# Patient Record
Sex: Male | Born: 1950 | Race: Black or African American | Hispanic: No | Marital: Married | Smoking: Never smoker
Health system: Southern US, Community
[De-identification: ages and names within clinical notes are randomized; demographics above are authoritative.]

## PROBLEM LIST (undated history)

## (undated) DIAGNOSIS — E785 Hyperlipidemia, unspecified: Secondary | ICD-10-CM

## (undated) DIAGNOSIS — F4310 Post-traumatic stress disorder, unspecified: Secondary | ICD-10-CM

## (undated) DIAGNOSIS — Z923 Personal history of irradiation: Secondary | ICD-10-CM

## (undated) DIAGNOSIS — F419 Anxiety disorder, unspecified: Secondary | ICD-10-CM

## (undated) DIAGNOSIS — F32A Depression, unspecified: Secondary | ICD-10-CM

## (undated) DIAGNOSIS — F329 Major depressive disorder, single episode, unspecified: Secondary | ICD-10-CM

## (undated) DIAGNOSIS — M1990 Unspecified osteoarthritis, unspecified site: Secondary | ICD-10-CM

## (undated) DIAGNOSIS — G4730 Sleep apnea, unspecified: Secondary | ICD-10-CM

## (undated) DIAGNOSIS — C61 Malignant neoplasm of prostate: Secondary | ICD-10-CM

## (undated) DIAGNOSIS — E291 Testicular hypofunction: Secondary | ICD-10-CM

## (undated) DIAGNOSIS — S76219A Strain of adductor muscle, fascia and tendon of unspecified thigh, initial encounter: Secondary | ICD-10-CM

## (undated) HISTORY — DX: Malignant neoplasm of prostate: C61

## (undated) HISTORY — DX: Testicular hypofunction: E29.1

## (undated) HISTORY — DX: Strain of adductor muscle, fascia and tendon of unspecified thigh, initial encounter: S76.219A

## (undated) HISTORY — DX: Major depressive disorder, single episode, unspecified: F32.9

## (undated) HISTORY — DX: Post-traumatic stress disorder, unspecified: F43.10

## (undated) HISTORY — DX: Unspecified osteoarthritis, unspecified site: M19.90

## (undated) HISTORY — DX: Personal history of irradiation: Z92.3

## (undated) HISTORY — PX: WISDOM TOOTH EXTRACTION: SHX21

## (undated) HISTORY — DX: Depression, unspecified: F32.A

## (undated) HISTORY — DX: Sleep apnea, unspecified: G47.30

## (undated) HISTORY — DX: Hyperlipidemia, unspecified: E78.5

## (undated) HISTORY — PX: TONSILLECTOMY: SUR1361

## (undated) HISTORY — DX: Anxiety disorder, unspecified: F41.9

---

## 2005-03-21 ENCOUNTER — Ambulatory Visit: Payer: Self-pay | Admitting: Internal Medicine

## 2009-06-24 ENCOUNTER — Ambulatory Visit: Payer: Self-pay | Admitting: Internal Medicine

## 2009-06-24 DIAGNOSIS — M1710 Unilateral primary osteoarthritis, unspecified knee: Secondary | ICD-10-CM

## 2009-06-24 DIAGNOSIS — F4310 Post-traumatic stress disorder, unspecified: Secondary | ICD-10-CM

## 2009-06-24 DIAGNOSIS — G4730 Sleep apnea, unspecified: Secondary | ICD-10-CM

## 2009-06-24 DIAGNOSIS — IMO0002 Reserved for concepts with insufficient information to code with codable children: Secondary | ICD-10-CM | POA: Insufficient documentation

## 2009-07-06 ENCOUNTER — Ambulatory Visit: Payer: Self-pay | Admitting: Internal Medicine

## 2009-07-06 LAB — CONVERTED CEMR LAB
BUN: 20 mg/dL (ref 6–23)
Basophils Absolute: 0.1 10*3/uL (ref 0.0–0.1)
Blood in Urine, dipstick: NEGATIVE
Chloride: 106 meq/L (ref 96–112)
Direct LDL: 170.3 mg/dL
Glucose, Bld: 83 mg/dL (ref 70–99)
Glucose, Urine, Semiquant: NEGATIVE
HCT: 45.1 % (ref 39.0–52.0)
HDL: 39.6 mg/dL (ref >39.00)
Lymphs Abs: 2.3 10*3/uL (ref 0.7–4.0)
MCHC: 34 g/dL (ref 30.0–36.0)
MCV: 102.6 fL — ABNORMAL HIGH (ref 78.0–100.0)
Monocytes Absolute: 0.3 10*3/uL (ref 0.1–1.0)
Platelets: 184 10*3/uL (ref 150.0–400.0)
Potassium: 4.1 meq/L (ref 3.5–5.1)
RDW: 12.4 % (ref 11.5–14.6)
Specific Gravity, Urine: 1.015
TSH: 1.29 microintl units/mL (ref 0.35–5.50)
Total Bilirubin: 1.6 mg/dL — ABNORMAL HIGH (ref 0.3–1.2)
WBC Urine, dipstick: NEGATIVE
pH: 5.5

## 2009-07-13 ENCOUNTER — Ambulatory Visit: Payer: Self-pay | Admitting: Internal Medicine

## 2009-11-10 ENCOUNTER — Ambulatory Visit: Payer: Self-pay | Admitting: Internal Medicine

## 2009-11-10 LAB — CONVERTED CEMR LAB
ALT: 38 units/L (ref 0–53)
AST: 33 units/L (ref 0–37)
Albumin: 3.6 g/dL (ref 3.5–5.2)
Alkaline Phosphatase: 52 units/L (ref 39–117)
Cholesterol: 217 mg/dL — ABNORMAL HIGH (ref 0–200)
Direct LDL: 137.9 mg/dL
Total Bilirubin: 1.3 mg/dL — ABNORMAL HIGH (ref 0.3–1.2)
Triglycerides: 191 mg/dL — ABNORMAL HIGH (ref 0.0–149.0)

## 2009-11-17 ENCOUNTER — Ambulatory Visit: Payer: Self-pay | Admitting: Internal Medicine

## 2009-11-17 DIAGNOSIS — N529 Male erectile dysfunction, unspecified: Secondary | ICD-10-CM

## 2009-11-17 DIAGNOSIS — E785 Hyperlipidemia, unspecified: Secondary | ICD-10-CM | POA: Insufficient documentation

## 2010-02-23 ENCOUNTER — Ambulatory Visit: Payer: Self-pay | Admitting: Internal Medicine

## 2010-02-23 LAB — CONVERTED CEMR LAB
Cholesterol: 249 mg/dL — ABNORMAL HIGH (ref 0–200)
HDL: 41.4 mg/dL (ref >39.00)
Total CHOL/HDL Ratio: 6
VLDL: 54.6 mg/dL — ABNORMAL HIGH (ref 0.0–40.0)

## 2010-06-23 ENCOUNTER — Telehealth: Payer: Self-pay | Admitting: Internal Medicine

## 2010-06-24 ENCOUNTER — Ambulatory Visit: Payer: Self-pay | Admitting: Family Medicine

## 2010-06-24 DIAGNOSIS — I10 Essential (primary) hypertension: Secondary | ICD-10-CM | POA: Insufficient documentation

## 2010-06-24 DIAGNOSIS — R51 Headache: Secondary | ICD-10-CM

## 2010-06-28 ENCOUNTER — Ambulatory Visit: Payer: Self-pay | Admitting: Cardiology

## 2010-07-26 ENCOUNTER — Ambulatory Visit (HOSPITAL_BASED_OUTPATIENT_CLINIC_OR_DEPARTMENT_OTHER)
Admission: RE | Admit: 2010-07-26 | Discharge: 2010-07-26 | Payer: Self-pay | Source: Home / Self Care | Admitting: Internal Medicine

## 2010-07-26 ENCOUNTER — Encounter: Payer: Self-pay | Admitting: Internal Medicine

## 2010-08-04 ENCOUNTER — Ambulatory Visit: Payer: Self-pay | Admitting: Pulmonary Disease

## 2010-08-10 ENCOUNTER — Ambulatory Visit: Payer: Self-pay | Admitting: Internal Medicine

## 2010-08-10 LAB — CONVERTED CEMR LAB
ALT: 35 units/L (ref 0–53)
Albumin: 3.9 g/dL (ref 3.5–5.2)
Basophils Relative: 0.6 % (ref 0.0–3.0)
Bilirubin Urine: NEGATIVE
CO2: 29 meq/L (ref 19–32)
Chloride: 103 meq/L (ref 96–112)
Cholesterol: 240 mg/dL — ABNORMAL HIGH (ref 0–200)
Direct LDL: 154 mg/dL
Eosinophils Absolute: 0.3 10*3/uL (ref 0.0–0.7)
Eosinophils Relative: 4 % (ref 0.0–5.0)
HCT: 43.8 % (ref 39.0–52.0)
Hemoglobin: 15 g/dL (ref 13.0–17.0)
MCHC: 34.3 g/dL (ref 30.0–36.0)
MCV: 101.9 fL — ABNORMAL HIGH (ref 78.0–100.0)
Monocytes Absolute: 0.6 10*3/uL (ref 0.1–1.0)
Neutro Abs: 4.2 10*3/uL (ref 1.4–7.7)
Nitrite: NEGATIVE
PSA: 2.92 ng/mL (ref 0.10–4.00)
Potassium: 4.2 meq/L (ref 3.5–5.1)
RBC: 4.3 M/uL (ref 4.22–5.81)
Sodium: 139 meq/L (ref 135–145)
Total CHOL/HDL Ratio: 6
Total Protein: 7.3 g/dL (ref 6.0–8.3)
Urobilinogen, UA: 1
VLDL: 37.8 mg/dL (ref 0.0–40.0)
WBC Urine, dipstick: NEGATIVE
WBC: 7.1 10*3/uL (ref 4.5–10.5)

## 2010-08-31 ENCOUNTER — Ambulatory Visit: Payer: Self-pay | Admitting: Internal Medicine

## 2010-10-12 NOTE — Progress Notes (Signed)
Summary: Pt having severe pains in head and dizzy spells  Phone Note Call from Patient Call back at [phone]   Caller: spouse - Vickey Reason for Call: Acute Illness Summary of Call: Pt has been having pains in his head and dizzy spells for over a week now and has been taking over the counter med, and nothing helps. Pt sayd pain is sharp,stabbing pain, and is sporatic all throughout the day. Pls advise. Pt req work in appt with Dr. Lovell Sheehan only or med called in to [facility] [address]   Initial call taken by: Lucy Antigua,  June 23, 2010 10:59 AM  Follow-up for Phone Call        obv given with dr Abner Greenspan for tomorrow Follow-up by: Willy Eddy, LPN,  June 23, 2010 12:02 PM

## 2010-10-12 NOTE — Assessment & Plan Note (Signed)
Summary: 4 mo rov/mm   Vital Signs:  Patient profile:   60 year old male Height:      73.5 inches Weight:      266 pounds BMI:     34.74 Temp:     98.2 degrees F oral Pulse rate:   76 / minute Resp:     14 per minute BP sitting:   130 / 82  (left arm)  Vitals Entered By: Willy Eddy, LPN (November 17, 3084 9:16 AM) CC: roa labs   CC:  roa labs.  History of Present Illness: weight loss impacted the lipids  Hyperlipidemia Follow-Up      This is a 60 year old man who presents for Hyperlipidemia follow-up.  The patient denies muscle aches, GI upset, abdominal pain, flushing, itching, constipation, diarrhea, and fatigue.  The patient denies the following symptoms: chest pain/pressure, exercise intolerance, dypsnea, palpitations, syncope, and pedal edema.  Compliance with medications (by patient report) has been near 100%.  Dietary compliance has been excellent.  The patient reports exercising daily.  Adjunctive measures currently used by the patient include fish oil supplements.    Preventive Screening-Counseling & Management  Alcohol-Tobacco     Smoking Status: never  Current Problems (verified): 1)  Health Maintenance Exam  (ICD-V70.0) 2)  Loc Osteoarthros Not Spec Prim/sec Lower Leg  (ICD-715.36) 3)  Groin Strain  (ICD-848.8) 4)  Ptsd  (ICD-309.81) 5)  Sleep Apnea  (ICD-780.57)  Current Medications (verified): 1)  None  Allergies (verified): No Known Drug Allergies  Past History:  Family History: Last updated: 06/24/2009 Family History High cholesterol mother has dementa father passed at 69 unknown sister has HTN  Social History: Last updated: 06/24/2009 Retired Married Never Smoked Alcohol use-no Drug use-no Regular exercise-no  Risk Factors: Exercise: no (06/24/2009)  Risk Factors: Smoking Status: never (11/17/2009)  Past medical, surgical, family and social histories (including risk factors) reviewed, and no changes noted (except as noted  below).  Past Medical History: Reviewed history from 06/24/2009 and no changes required. sleep apnea pulled groin muscle post traumatic stress syndrome  Family History: Reviewed history from 06/24/2009 and no changes required. Family History High cholesterol mother has dementa father passed at 3 unknown sister has HTN  Social History: Reviewed history from 06/24/2009 and no changes required. Retired Married Never Smoked Alcohol use-no Drug use-no Regular exercise-no  Review of Systems  The patient denies anorexia, fever, weight loss, weight gain, vision loss, decreased hearing, hoarseness, chest pain, syncope, dyspnea on exertion, peripheral edema, prolonged cough, headaches, hemoptysis, abdominal pain, melena, hematochezia, severe indigestion/heartburn, hematuria, incontinence, genital sores, muscle weakness, suspicious skin lesions, transient blindness, difficulty walking, depression, unusual weight change, abnormal bleeding, enlarged lymph nodes, angioedema, and breast masses.    Physical Exam  General:  Well-developed,well-nourished,in no acute distress; alert,appropriate and cooperative throughout examination Head:  normocephalic, atraumatic, and male-pattern balding.   Ears:  R ear normal and L ear normal.   Nose:  no external deformity and no nasal discharge.   Neck:  No deformities, masses, or tenderness noted. Lungs:  Normal respiratory effort, chest expands symmetrically. Lungs are clear to auscultation, no crackles or wheezes. Heart:  Normal rate and regular rhythm. S1 and S2 normal without gallop, murmur, click, rub or other extra sounds. Abdomen:  Bowel sounds positive,abdomen soft and non-tender without masses, organomegaly or hernias noted.   Impression & Recommendations:  Problem # 1:  HYPERLIPIDEMIA, BORDERLINE (ICD-272.4) continued weight reducton and increase the fish oil  Labs Reviewed: SGOT:  33 (11/10/2009)   SGPT: 38 (11/10/2009)   HDL:44.00  (11/10/2009), 39.60 (07/06/2009)  Chol:217 (11/10/2009), 254 (07/06/2009)  Trig:191.0 (11/10/2009), 182.0 (07/06/2009)  Problem # 2:  ERECTILE DYSFUNCTION, ORGANIC (ICD-607.84)  Discussed proper use of medications, as well as side effects.   His updated medication list for this problem includes:    Cialis 5 Mg Tabs (Tadalafil) .Marland Kitchen... Take as directed  Problem # 3:  SLEEP APNEA (ICD-780.57) weight loss  Complete Medication List: 1)  Fish Oil Concentrate 1000 Mg Caps (Omega-3 fatty acids) .... Two by mouth two times a day 2)  Cialis 5 Mg Tabs (Tadalafil) .... Take as directed  Patient Instructions: 1)  Please schedule a follow-up appointment in 3 months. 2)  Hepatic Panel prior to visit, ICD-9:995.20 3)  Lipid Panel prior to visit, ICD-9:272.4 Prescriptions: CIALIS 5 MG TABS (TADALAFIL) Take as directed  #30 x 0   Entered and Authorized by:   Stacie Glaze MD   Signed by:   Stacie Glaze MD on 11/17/2009   Method used:   Print then Give to Patient   RxID:   [id number]
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
## 2010-10-12 NOTE — Assessment & Plan Note (Signed)
Summary: 3 month rov/njr   Vital Signs:  Patient profile:   60 year old male Height:      73.5 inches Weight:      262 pounds BMI:     34.22 Temp:     98.2 degrees F oral Pulse rate:   76 / minute Resp:     14 per minute BP sitting:   124 / 80  (left arm)  Vitals Entered By: Willy Eddy, LPN (February 23, 2010 9:43 AM) CC: roa-discuss labs from last ov, Lipid Management   CC:  roa-discuss labs from last ov and Lipid Management.  History of Present Illness: weigt is down and EXERCIZING oa IN KNEES is stable   Hyperlipidemia Follow-Up      This is a 60 year old man who presents for Hyperlipidemia follow-up.  The patient denies muscle aches, GI upset, abdominal pain, flushing, itching, constipation, diarrhea, and fatigue.  The patient denies the following symptoms: chest pain/pressure, exercise intolerance, dypsnea, palpitations, syncope, and pedal edema.  Compliance with medications (by patient report) has been near 100%.  Dietary compliance has been fair.  The patient reports exercising occasionally.  Adjunctive measures currently used by the patient include fish oil supplements.    Lipid Management History:      Positive NCEP/ATP III risk factors include male age 109 years old or older.  Negative NCEP/ATP III risk factors include non-tobacco-user status, non-hypertensive, no ASHD (atherosclerotic heart disease), no prior stroke/TIA, no peripheral vascular disease, and no history of aortic aneurysm.     Preventive Screening-Counseling & Management  Alcohol-Tobacco     Smoking Status: never  Current Problems (verified): 1)  Erectile Dysfunction, Organic  (ICD-607.84) 2)  Hyperlipidemia, Borderline  (ICD-272.4) 3)  Health Maintenance Exam  (ICD-V70.0) 4)  Loc Osteoarthros Not Spec Prim/sec Lower Leg  (ICD-715.36) 5)  Groin Strain  (ICD-848.8) 6)  Ptsd  (ICD-309.81) 7)  Sleep Apnea  (ICD-780.57)  Current Medications (verified): 1)  Fish Oil Concentrate 1000 Mg Caps  (Omega-3 Fatty Acids) .... Two By Mouth Two Times A Day 2)  Cialis 5 Mg Tabs (Tadalafil) .... Take As Directed  Allergies (verified): No Known Drug Allergies  Past History:  Family History: Last updated: 06/24/2009 Family History High cholesterol mother has dementa father passed at 20 unknown sister has HTN  Social History: Last updated: 06/24/2009 Retired Married Never Smoked Alcohol use-no Drug use-no Regular exercise-no  Risk Factors: Exercise: no (06/24/2009)  Risk Factors: Smoking Status: never (02/23/2010)  Past medical, surgical, family and social histories (including risk factors) reviewed, and no changes noted (except as noted below).  Past Medical History: Reviewed history from 06/24/2009 and no changes required. sleep apnea pulled groin muscle post traumatic stress syndrome  Family History: Reviewed history from 06/24/2009 and no changes required. Family History High cholesterol mother has dementa father passed at 64 unknown sister has HTN  Social History: Reviewed history from 06/24/2009 and no changes required. Retired Married Never Smoked Alcohol use-no Drug use-no Regular exercise-no  Review of Systems  The patient denies anorexia, fever, weight loss, weight gain, vision loss, decreased hearing, hoarseness, chest pain, syncope, dyspnea on exertion, peripheral edema, prolonged cough, headaches, hemoptysis, abdominal pain, melena, hematochezia, severe indigestion/heartburn, hematuria, incontinence, genital sores, muscle weakness, suspicious skin lesions, transient blindness, difficulty walking, depression, unusual weight change, abnormal bleeding, enlarged lymph nodes, angioedema, and breast masses.    Physical Exam  General:  Well-developed,well-nourished,in no acute distress; alert,appropriate and cooperative throughout examination Head:  normocephalic, atraumatic, and male-pattern  balding.   Eyes:  pupils equal and pupils round.   Ears:   R ear normal and L ear normal.   Nose:  no external deformity and no nasal discharge.   Mouth:  good dentition and pharynx pink and moist.   Lungs:  Normal respiratory effort, chest expands symmetrically. Lungs are clear to auscultation, no crackles or wheezes. Heart:  Normal rate and regular rhythm. S1 and S2 normal without gallop, murmur, click, rub or other extra sounds.   Impression & Recommendations:  Problem # 1:  HYPERLIPIDEMIA, BORDERLINE (ICD-272.4)  Labs Reviewed: SGOT: 33 (11/10/2009)   SGPT: 38 (11/10/2009)  Lipid Goals: Chol Goal: 200 (02/23/2010)   HDL Goal: 40 (02/23/2010)   LDL Goal: 160 (02/23/2010)   TG Goal: 150 (02/23/2010)  10 Yr Risk Heart Disease: Not enough information   HDL:44.00 (11/10/2009), 39.60 (07/06/2009)  Chol:217 (11/10/2009), 254 (07/06/2009)  Trig:191.0 (11/10/2009), 182.0 (07/06/2009)  Orders: Venipuncture (16109) TLB-Lipid Panel (80061-LIPID)  Problem # 2:  PTSD (ICD-309.81) stable  Problem # 3:  LOC OSTEOARTHROS NOT SPEC PRIM/SEC LOWER LEG (ICD-715.36)  knee pain with noting grinding in knees with am STIFFNESS  Discussed use of medications, application of heat or cold, and exercises.   Problem # 4:  ERECTILE DYSFUNCTION, ORGANIC (ICD-607.84)  His updated medication list for this problem includes:    Cialis 5 Mg Tabs (Tadalafil) .Marland Kitchen... Take as directed  Discussed proper use of medications, as well as side effects.   Complete Medication List: 1)  Fish Oil Concentrate 1000 Mg Caps (Omega-3 fatty acids) .... Two by mouth two times a day 2)  Cialis 5 Mg Tabs (Tadalafil) .... Take as directed  Lipid Assessment/Plan:      Based on NCEP/ATP III, the patient's risk factor category is "0-1 risk factors".  The patient's lipid goals are as follows: Total cholesterol goal is 200; LDL cholesterol goal is 160; HDL cholesterol goal is 40; Triglyceride goal is 150.  His LDL cholesterol goal has been met.    Patient Instructions: 1)  Please schedule  a follow-up appointment in 6 months.  CPX

## 2010-10-14 NOTE — Assessment & Plan Note (Signed)
Summary: cpx//ccm rsc bmp/njr/pt rescd from bump//ccm   Vital Signs:  Patient profile:   60 year old male Height:      73.5 inches (186.69 cm) Weight:      275 pounds (125.00 kg) BMI:     35.92 O2 Sat:      95 % on Room air Temp:     98.4 degrees F (36.89 degrees C) oral Pulse rate:   72 / minute BP sitting:   122 / 86  (left arm) Cuff size:   large  Vitals Entered By: Brenton Grills CMA Duncan Dull) (August 31, 2010 8:31 AM)  O2 Flow:  Room air CC: CPX/aj Is Patient Diabetic? No   Primary Care Lou Irigoyen:  Stacie Glaze MD  CC:  CPX/aj.  History of Present Illness: The pt was asked about all immunizations, health maint. services that are appropriate to their age and was given guidance on diet exercize  and weight managemen the pt has increasing lipids and has now LDL out of goal. The pt has ED and requests samples of cialis.     Preventive Screening-Counseling & Management  Alcohol-Tobacco     Smoking Status: never     Tobacco Counseling: not indicated; no tobacco use  Current Medications (verified): 1)  Fish Oil Concentrate 1000 Mg Caps (Omega-3 Fatty Acids) .... Two By Mouth Two Times A Day 2)  Cialis 5 Mg Tabs (Tadalafil) .... Take As Directed 3)  Naprosyn 500 Mg Tabs (Naproxen) .Marland Kitchen.. 1 Tab By Mouth Two Times A Day As Needed Pain With Food 4)  Cephadyn 50-650 Mg Tabs (Butalbital-Acetaminophen) .Marland Kitchen.. 1 Tab By Mouth Three Times A Day As Needed Ha  Allergies (verified): No Known Drug Allergies  Past History:  Family History: Last updated: 06/24/2009 Family History High cholesterol mother has dementa father passed at 71 unknown sister has HTN  Social History: Last updated: 06/24/2009 Retired Married Never Smoked Alcohol use-no Drug use-no Regular exercise-no  Risk Factors: Exercise: no (06/24/2009)  Risk Factors: Smoking Status: never (08/31/2010)  Past medical, surgical, family and social histories (including risk factors) reviewed, and no changes  noted (except as noted below).  Past Medical History: Reviewed history from 06/24/2009 and no changes required. sleep apnea pulled groin muscle post traumatic stress syndrome  Family History: Reviewed history from 06/24/2009 and no changes required. Family History High cholesterol mother has dementa father passed at 38 unknown sister has HTN  Social History: Reviewed history from 06/24/2009 and no changes required. Retired Married Never Smoked Alcohol use-no Drug use-no Regular exercise-no  Review of Systems  The patient denies anorexia, fever, weight loss, weight gain, vision loss, decreased hearing, hoarseness, chest pain, syncope, dyspnea on exertion, peripheral edema, prolonged cough, headaches, hemoptysis, abdominal pain, melena, hematochezia, severe indigestion/heartburn, hematuria, incontinence, genital sores, muscle weakness, suspicious skin lesions, transient blindness, difficulty walking, depression, unusual weight change, abnormal bleeding, enlarged lymph nodes, angioedema, breast masses, and testicular masses.    Physical Exam  General:  Well-developed,well-nourished,in no acute distress; alert,appropriate and cooperative throughout examination Head:  Normocephalic and atraumatic without obvious abnormalities. No apparent alopecia or balding. Eyes:  No corneal or conjunctival inflammation noted. EOMI. Perrla.  Ears:  External ear exam shows no significant lesions or deformities.  Otoscopic examination reveals clear canals, tympanic membranes are intact bilaterally without bulging, retraction, inflammation or discharge. Hearing is grossly normal bilaterally. Nose:  External nasal examination shows no deformity or inflammation. Nasal mucosa are pink and moist without lesions or exudates. Mouth:  Oral mucosa and oropharynx  without lesions or exudates.  Teeth in good repair. Neck:  No deformities, masses, or tenderness noted. Lungs:  Normal respiratory effort, chest  expands symmetrically. Lungs are clear to auscultation, no crackles or wheezes. Heart:  Normal rate and regular rhythm. S1 and S2 normal without gallop, murmur, click, rub or other extra sounds. Abdomen:  Bowel sounds positive,abdomen soft and non-tender without masses, organomegaly or hernias noted. Msk:  No deformity or scoliosis noted of thoracic or lumbar spine.   Pulses:  R and L dorsalis pedis and posterior tibial pulses are full and equal bilaterally Extremities:  No clubbing, cyanosis, edema, or deformity noted with normal full range of motion of all joints.   Neurologic:  No cranial nerve deficits noted. Station and gait are normal. Plantar reflexes are down-going bilaterally. DTRs are symmetrical throughout. Sensory, motor and coordinative functions appear intact.   Impression & Recommendations:  Problem # 1:  SLEEP APNEA (ICD-780.57) Assessment New mild by sleep study AHI of 8 snores astelin and saline protocol disucssed with pt monitering and consider referral to pulmonary  Problem # 2:  ELEVATED BP READING WITHOUT DX HYPERTENSION (ICD-796.2) salt monitering BP today: 122/86 Prior BP: 140/100 (06/24/2010)  Prior 10 Yr Risk Heart Disease: Not enough information (02/23/2010)  Labs Reviewed: Creat: 1.2 (08/10/2010) Chol: 240 (08/10/2010)   HDL: 42.30 (08/10/2010)   TG: 189.0 (08/10/2010)  Instructed in low sodium diet (DASH Handout) and behavior modification.    Problem # 3:  ERECTILE DYSFUNCTION, ORGANIC (ICD-607.84) sampls given His updated medication list for this problem includes:    Cialis 5 Mg Tabs (Tadalafil) .Marland Kitchen... Take as directed  Discussed proper use of medications, as well as side effects.   Problem # 4:  HEALTH MAINTENANCE EXAM (ICD-V70.0) the pts lipids are not at goal he states that he stopped working ands started snacking ( weakness for potatochips) Td Booster: Tdap (06/24/2009)   Flu Vax: Fluvax 3+ (06/24/2010)   Chol: 240 (08/10/2010)   HDL: 42.30  (08/10/2010)   TG: 189.0 (08/10/2010) TSH: 1.57 (08/10/2010)   PSA: 2.92 (08/10/2010)  Discussed using sunscreen, use of alcohol, drug use, self testicular exam, routine dental care, routine eye care, routine physical exam, seat belts, multiple vitamins, osteoporosis prevention, adequate calcium intake in diet, and recommendations for immunizations.  Discussed exercise and checking cholesterol.  Discussed gun safety, safe sex, and contraception. Also recommend checking PSA.  Problem # 5:  HYPERLIPIDEMIA, BORDERLINE (ICD-272.4) Assessment: Deteriorated  samples of crestor 20 mg one a week to get off ... loose 10 pounds stay away form the chips Labs Reviewed: SGOT: 38 (08/10/2010)   SGPT: 35 (08/10/2010)  Lipid Goals: Chol Goal: 200 (02/23/2010)   HDL Goal: 40 (02/23/2010)   LDL Goal: 160 (02/23/2010)   TG Goal: 150 (02/23/2010)  Prior 10 Yr Risk Heart Disease: Not enough information (02/23/2010)   HDL:42.30 (08/10/2010), 41.40 (02/23/2010)  Chol:240 (08/10/2010), 249 (02/23/2010)  Trig:189.0 (08/10/2010), 273.0 (02/23/2010)  His updated medication list for this problem includes:    Crestor 20 Mg Tabs (Rosuvastatin calcium) ..... One by mouth weekly  Complete Medication List: 1)  Fish Oil Concentrate 1000 Mg Caps (Omega-3 fatty acids) .... Two by mouth two times a day 2)  Cialis 5 Mg Tabs (Tadalafil) .... Take as directed 3)  Naprosyn 500 Mg Tabs (Naproxen) .Marland Kitchen.. 1 tab by mouth two times a day as needed pain with food 4)  Cephadyn 50-650 Mg Tabs (Butalbital-acetaminophen) .Marland Kitchen.. 1 tab by mouth three times a day as needed ha 5)  Crestor 20 Mg Tabs (Rosuvastatin calcium) .... One by mouth weekly  Patient Instructions: 1)  nasal saline spray and rinse sinuses ( irrigate and blow nose) 2)  then use two sprays of astelin in each nostril before bed 3)  to get off the crestor must loose 10 lbs and sty of the lays!!!! 4)  take the crestor 20 mg one night a week 5)  Please schedule a follow-up  appointment in 3 months. 6)  Hepatic Panel prior to visit, ICD-9:995.20 7)  Lipid Panel prior to visit, ICD-9:272.4   Orders Added: 1)  Est. Patient 40-64 years [99396] 2)  Est. Patient Level III [82956]

## 2010-11-26 ENCOUNTER — Other Ambulatory Visit (INDEPENDENT_AMBULATORY_CARE_PROVIDER_SITE_OTHER): Admitting: Internal Medicine

## 2010-11-26 DIAGNOSIS — E785 Hyperlipidemia, unspecified: Secondary | ICD-10-CM

## 2010-11-26 DIAGNOSIS — T887XXA Unspecified adverse effect of drug or medicament, initial encounter: Secondary | ICD-10-CM

## 2010-11-26 LAB — HEPATIC FUNCTION PANEL
ALT: 42 U/L (ref 0–53)
AST: 36 U/L (ref 0–37)
Bilirubin, Direct: 0.2 mg/dL (ref 0.0–0.3)
Total Bilirubin: 1.4 mg/dL — ABNORMAL HIGH (ref 0.3–1.2)
Total Protein: 7.4 g/dL (ref 6.0–8.3)

## 2010-11-26 LAB — LIPID PANEL: Total CHOL/HDL Ratio: 5

## 2010-11-26 LAB — LDL CHOLESTEROL, DIRECT: Direct LDL: 136.6 mg/dL

## 2010-12-01 ENCOUNTER — Encounter: Payer: Self-pay | Admitting: Internal Medicine

## 2010-12-03 ENCOUNTER — Ambulatory Visit (INDEPENDENT_AMBULATORY_CARE_PROVIDER_SITE_OTHER): Admitting: Internal Medicine

## 2010-12-03 ENCOUNTER — Encounter: Payer: Self-pay | Admitting: Internal Medicine

## 2010-12-03 DIAGNOSIS — F4310 Post-traumatic stress disorder, unspecified: Secondary | ICD-10-CM

## 2010-12-03 DIAGNOSIS — R030 Elevated blood-pressure reading, without diagnosis of hypertension: Secondary | ICD-10-CM

## 2010-12-03 DIAGNOSIS — E785 Hyperlipidemia, unspecified: Secondary | ICD-10-CM

## 2010-12-03 NOTE — Assessment & Plan Note (Signed)
Continue post therapy and weight loss with a goal of weight losing weight to  250 pounds

## 2010-12-03 NOTE — Assessment & Plan Note (Signed)
Blood pressure is stable and outpatient readings are within normal limits angina question of hypertension weight loss is the assurance that hypertension we'll not become a problem

## 2010-12-03 NOTE — Progress Notes (Signed)
  Subjective:    Patient ID: Justin Winters, male    DOB: 04-Aug-1951, 60 y.o.   MRN: [id number]  HPI   patient is a 60 year old African American male who presents for followup of hyperlipidemia labs were drawn prior to this office visit his comorbid diagnoses include posttraumatic stress disorder sleep apnea elevated blood pressure without diagnosis of hypertension.  He has been monitoring his diet and has lost weight his blood pressure today is 132/90 which is improved he states that checking his blood pressure at home and has been in the normal range since his last office visit he has modified his salt and increase his exercise.   for cholesterol he is on pulse therapy with Crestor 20 mg once weekly and has a 30 point drop in his total cholesterol and LDL she has got below 1:30 we discussed continued diet exercise and a weight loss program with this patient  Review of Systems  Constitutional: Negative for fever and fatigue.  HENT: Negative for hearing loss, congestion, neck pain and postnasal drip.   Eyes: Negative for discharge, redness and visual disturbance.  Respiratory: Negative for cough, shortness of breath and wheezing.   Cardiovascular: Negative for leg swelling.  Gastrointestinal: Negative for abdominal pain, constipation and abdominal distention.  Genitourinary: Negative for urgency and frequency.  Musculoskeletal: Negative for joint swelling and arthralgias.  Skin: Negative for color change and rash.  Neurological: Negative for weakness and light-headedness.  Hematological: Negative for adenopathy.  Psychiatric/Behavioral: Negative for behavioral problems.   Past Medical History  Diagnosis Date  . Sleep apnea   . Groin strain   . Post-traumatic stress syndrome    History reviewed. No pertinent past surgical history.  reports that he has never smoked. He does not have any smokeless tobacco history on file. He reports that he does not drink alcohol or use illicit  drugs. family history includes Alzheimer's disease in his mother; Dementia in his mother; and Heart disease in his father. No Known Allergies      Objective:   Physical Exam  Constitutional: He is oriented to person, place, and time. He appears well-developed and well-nourished.  HENT:  Head: Normocephalic and atraumatic.  Eyes: Conjunctivae are normal. Pupils are equal, round, and reactive to light.  Neck: Normal range of motion. Neck supple.  Cardiovascular: Normal rate and regular rhythm.   Pulmonary/Chest: Effort normal and breath sounds normal.  Abdominal: Soft. Bowel sounds are normal.  Neurological: He is alert and oriented to person, place, and time.  Skin: Skin is warm and dry.  Psychiatric: His behavior is normal. Thought content normal.          Assessment & Plan:   patient will continue the Crestor 20 mg weekly on a pulse therapy he is to continue his weight loss and exercise program with the goal to be less than 150 pounds by his next office visit his blood pressure is stable and the question of hypertension now has been resolved with monitoring both as an outpatient and inpatient.  He is stable for his posttraumatic stress disorder and followed by the [facility] his sleep apnea is also followed by the [facility] he is given samples of Cialis for his erectile dysfunction

## 2011-03-01 ENCOUNTER — Encounter: Payer: Self-pay | Admitting: Internal Medicine

## 2011-03-11 ENCOUNTER — Ambulatory Visit (INDEPENDENT_AMBULATORY_CARE_PROVIDER_SITE_OTHER): Admitting: Internal Medicine

## 2011-03-11 ENCOUNTER — Encounter: Payer: Self-pay | Admitting: Internal Medicine

## 2011-03-11 ENCOUNTER — Ambulatory Visit: Admitting: Internal Medicine

## 2011-03-11 DIAGNOSIS — R030 Elevated blood-pressure reading, without diagnosis of hypertension: Secondary | ICD-10-CM

## 2011-03-11 DIAGNOSIS — G4730 Sleep apnea, unspecified: Secondary | ICD-10-CM

## 2011-03-11 DIAGNOSIS — M1710 Unilateral primary osteoarthritis, unspecified knee: Secondary | ICD-10-CM

## 2011-03-11 DIAGNOSIS — E785 Hyperlipidemia, unspecified: Secondary | ICD-10-CM

## 2011-03-11 NOTE — Progress Notes (Signed)
  Subjective:    Patient ID: Justin Winters, male    DOB: 08/15/1951, 60 y.o.   MRN: [id number]  HPI Justin Winters presents in followup of recorded elevated blood pressure he has been following a diet and monitoring his blood pressure losing weight and exercising in an attempt to avoid antihypertensive medications he is successfully follow a low salt low calorie diet has lost weight outside monitoring blood pressure is between 115 125 systolic.  Today his blood pressure was 126/80 and he is doing well he denies any chest pain shortness of breath PND orthopnea or any abdominal swelling.  He has a target goal of 250 pounds.  His arthritis in his knee has improved with this as well as his sleep apnea he continues to have erectile dysfunction    Review of Systems  Constitutional: Negative for fever and fatigue.  HENT: Negative for hearing loss, congestion, neck pain and postnasal drip.   Eyes: Negative for discharge, redness and visual disturbance.  Respiratory: Negative for cough, shortness of breath and wheezing.   Cardiovascular: Negative for leg swelling.  Gastrointestinal: Negative for abdominal pain, constipation and abdominal distention.  Genitourinary: Negative for urgency and frequency.  Musculoskeletal: Negative for joint swelling and arthralgias.  Skin: Negative for color change and rash.  Neurological: Negative for weakness and light-headedness.  Hematological: Negative for adenopathy.  Psychiatric/Behavioral: Negative for behavioral problems.   Past Medical History  Diagnosis Date  . Sleep apnea   . Groin strain   . Post-traumatic stress syndrome   . Groin strain    No past surgical history on file.  reports that he has never smoked. He does not have any smokeless tobacco history on file. He reports that he does not drink alcohol or use illicit drugs. family history includes Alzheimer's disease in his mother; Dementia in his mother; Heart disease in his father; Hyperlipidemia  in an unspecified family member; and Hypertension in his sister. No Known Allergies     Objective:   Physical Exam  Constitutional: He is oriented to person, place, and time. He appears well-developed and well-nourished.  HENT:  Head: Normocephalic and atraumatic.  Eyes: Conjunctivae are normal. Pupils are equal, round, and reactive to light.  Neck: Normal range of motion. Neck supple.  Cardiovascular: Normal rate and regular rhythm.   Pulmonary/Chest: Effort normal and breath sounds normal.  Abdominal: Soft. Bowel sounds are normal.  Musculoskeletal: Normal range of motion.  Neurological: He is alert and oriented to person, place, and time.          Assessment & Plan:  Patient has controlled his blood pressure and avoid medications at this time but he needs to be vigilant to continue exercise weight control we discussed with him of these criteria to continue to be off of pressure medicine.  He'll continue to take Crestor 20 mg one by mouth weekly.  He was given samples of Cialis for erectile dysfunction.

## 2011-04-22 ENCOUNTER — Ambulatory Visit (INDEPENDENT_AMBULATORY_CARE_PROVIDER_SITE_OTHER): Admitting: Internal Medicine

## 2011-04-22 ENCOUNTER — Encounter: Payer: Self-pay | Admitting: Internal Medicine

## 2011-04-22 DIAGNOSIS — G4730 Sleep apnea, unspecified: Secondary | ICD-10-CM

## 2011-04-22 DIAGNOSIS — F4310 Post-traumatic stress disorder, unspecified: Secondary | ICD-10-CM

## 2011-04-22 DIAGNOSIS — M1710 Unilateral primary osteoarthritis, unspecified knee: Secondary | ICD-10-CM

## 2011-04-22 DIAGNOSIS — R51 Headache: Secondary | ICD-10-CM

## 2011-04-22 DIAGNOSIS — N529 Male erectile dysfunction, unspecified: Secondary | ICD-10-CM

## 2011-04-22 NOTE — Progress Notes (Signed)
Subjective:    Patient ID: Justin Winters, male    DOB: September 17, 1950, 60 y.o.   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  Patient has a PTSD form that will be completed by his psychiatrist/psychologist at the [facility] due to be functioning evaluation and the specific axis diagnosis necessary for that form to be completed.  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 Patient's erectile dysfunction has been treated in the past with the medications he is currently taking Cialis. PSA has been normal testosterone will be monitored today.  The patient has documented osteoarthritis of his lower leg he has been on chronic Naprosyn 500 mg twice a day and omega-3 fatty acids.  He will need a functional evaluation by physical therapist  I will partially complete the forms that are appropriate for primary care and refer him for functional assessment with physical therapy The patient had a positive sleep study done last year in November and he has a repeat study scheduled for the [facility] for  confirmation. We will complete the sleep apnea forms much as possible I am sure that they'll be additional documentation available to the [facility].  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 He says his headache are associated with his depression and posttraumatic stress.      Review of Systems  Constitutional: Negative for fever and fatigue.  HENT: Negative for hearing loss, congestion, neck pain and postnasal drip.   Eyes: Negative for discharge, redness and visual disturbance.   Respiratory: Negative for cough, shortness of breath and wheezing.   Cardiovascular: Negative for leg swelling.  Gastrointestinal: Negative for abdominal pain, constipation and abdominal distention.  Genitourinary: Negative for urgency and frequency.  Musculoskeletal: Negative for joint swelling and arthralgias.  Skin: Negative for color change and rash.  Neurological: Negative for weakness and light-headedness.  Hematological: Negative for adenopathy.  Psychiatric/Behavioral: Negative for behavioral problems.   Past Medical History  Diagnosis Date  . Sleep apnea   . Groin strain   . Post-traumatic stress syndrome   . Groin strain    History reviewed. No pertinent past surgical history.  reports that he has never smoked. He does not have any smokeless tobacco history on file. He reports that he does not drink alcohol or use illicit drugs. family history includes Alzheimer's disease in his mother; Dementia in his mother; Heart disease in his father; Hyperlipidemia in an unspecified family member; and Hypertension in his sister. No Known Allergies     Objective:   Physical Exam On physical examination he is a pleasant  well-nourished African American male in no apparent distress HEENT was normal neck was supple without JVD and bruit he did have mild septal deviation  Oropharynx was clear No carotid bruits were detected   lung fields were clear to auscultation and percussion Heart examination with a regular rate and rhythm Abdomen was soft nontender Extremity examination reveals no cyanosis clubbing or edema Neurological examination revealed a pleasant alert African American male oriented to person place and time He has subjective decreased concentration Wife reports issues with anger Patient reports difficulty with sleep hygiene Patient  is seeing a psychologist Examination of his joints revealed marked joint mice in the knees bilaterally There is moderate knee effusions right  greater than left There signs of osteoarthritis of ankles bilateral        Assessment & Plan:  We'll complete the forms from the [facility] for knee and lower leg rectal dysfunction sleep apnea and headaches as much as practicable.  His erectile dysfunction has been documented discharge multiple times he is currently on Cialis we'll check a testosterone level today. The erectile dysfunction is more likely than not connected to his depression anxiety and posttraumatic stress.  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  He has chronic daily headaches these headaches are more likely than not related to stress and may be associated with both his depression and his traumatic stress  He has mild to moderate sleep apnea is undergoing further evaluation at the [place].  We spent more than 45 minutes face-to-face with the patient in counseling and discussion with the patient and his wife what happened this time was spent in counseling and completion of forms.

## 2011-04-26 ENCOUNTER — Telehealth: Payer: Self-pay | Admitting: Internal Medicine

## 2011-04-26 NOTE — Telephone Encounter (Signed)
Wife informed the papers are co mpleted and are ready for pick up

## 2011-04-26 NOTE — Telephone Encounter (Signed)
Pt  Dropped off papers from the [place] when he was here on his appt pt was wonder if they were ready to be picked up

## 2011-06-27 ENCOUNTER — Other Ambulatory Visit

## 2011-06-28 ENCOUNTER — Other Ambulatory Visit (INDEPENDENT_AMBULATORY_CARE_PROVIDER_SITE_OTHER)

## 2011-06-28 ENCOUNTER — Other Ambulatory Visit: Payer: Self-pay | Admitting: Internal Medicine

## 2011-06-28 DIAGNOSIS — E785 Hyperlipidemia, unspecified: Secondary | ICD-10-CM

## 2011-06-28 LAB — LDL CHOLESTEROL, DIRECT: Direct LDL: 165.1 mg/dL

## 2011-06-28 LAB — LIPID PANEL: HDL: 44.8 mg/dL (ref >39.00)

## 2011-07-04 ENCOUNTER — Ambulatory Visit: Admitting: Internal Medicine

## 2011-07-11 ENCOUNTER — Ambulatory Visit (INDEPENDENT_AMBULATORY_CARE_PROVIDER_SITE_OTHER): Admitting: Internal Medicine

## 2011-07-11 ENCOUNTER — Encounter: Payer: Self-pay | Admitting: Internal Medicine

## 2011-07-11 DIAGNOSIS — R030 Elevated blood-pressure reading, without diagnosis of hypertension: Secondary | ICD-10-CM

## 2011-07-11 DIAGNOSIS — N529 Male erectile dysfunction, unspecified: Secondary | ICD-10-CM

## 2011-07-11 DIAGNOSIS — Z23 Encounter for immunization: Secondary | ICD-10-CM

## 2011-07-11 DIAGNOSIS — E785 Hyperlipidemia, unspecified: Secondary | ICD-10-CM

## 2011-07-11 DIAGNOSIS — R635 Abnormal weight gain: Secondary | ICD-10-CM

## 2011-07-11 MED ORDER — ROSUVASTATIN CALCIUM 20 MG PO TABS: 20.0000 mg | ORAL_TABLET | ORAL | Status: DC

## 2011-07-11 NOTE — Patient Instructions (Signed)
Prior to your next office visit we need to exercise on regular basis and lose at least 10lbs

## 2011-07-11 NOTE — Progress Notes (Signed)
  Subjective:    Patient ID: Justin Winters, male    DOB: 1950/10/16, 60 y.o.   MRN: [id number]  HPI  This is a 60 year old African American male who presents for followup of elevated blood pressure which he is treated with a low salt diet and has successfully lowered and hyperlipidemia for which he is taking Crestor 20 mg once weekly and has not achieved goal.  He has gained 10 pounds over the last 3 months he is not following a diet nor has he fallen a good exercise program.  He understands these risks factors and agrees to modify them at today's visit  Review of Systems  Constitutional: Negative for fever and fatigue.  HENT: Negative for hearing loss, congestion, neck pain and postnasal drip.   Eyes: Negative for discharge, redness and visual disturbance.  Respiratory: Negative for cough, shortness of breath and wheezing.   Cardiovascular: Negative for leg swelling.  Gastrointestinal: Negative for abdominal pain, constipation and abdominal distention.  Genitourinary: Negative for urgency and frequency.  Musculoskeletal: Negative for joint swelling and arthralgias.  Skin: Negative for color change and rash.  Neurological: Negative for weakness and light-headedness.  Hematological: Negative for adenopathy.  Psychiatric/Behavioral: Negative for behavioral problems.   Past Medical History  Diagnosis Date  . Sleep apnea   . Groin strain   . Post-traumatic stress syndrome   . Groin strain   . Hyperlipidemia   . Hypogonadism male    No past surgical history on file.  reports that he has never smoked. He does not have any smokeless tobacco history on file. He reports that he does not drink alcohol or use illicit drugs. family history includes Alzheimer's disease in his mother; Dementia in his mother; Heart disease in his father; Hyperlipidemia in an unspecified family member; and Hypertension in his sister. No Known Allergies     Objective:   Physical Exam  Nursing note and vitals  reviewed. Constitutional: He appears well-developed and well-nourished.  HENT:  Head: Normocephalic and atraumatic.  Eyes: Conjunctivae are normal. Pupils are equal, round, and reactive to light.  Neck: Normal range of motion. Neck supple.  Cardiovascular: Normal rate and regular rhythm.   Pulmonary/Chest: Effort normal and breath sounds normal.  Abdominal: Soft. Bowel sounds are normal.          Assessment & Plan:  The patient will begin Crestor 20 mg Monday Wednesday Friday he'll follow a weight loss protocol with goal 10 pounds for the next treatment he presents for his complete physical examination 3 months.  He'll continue a low-salt diet which has been able to lower his blood pressure he is given samples of Cialis for his erectile dysfunction

## 2011-07-20 ENCOUNTER — Telehealth: Payer: Self-pay | Admitting: Family Medicine

## 2011-07-20 NOTE — Telephone Encounter (Signed)
Per [facility], Justin Winters Crestor was denied because there is no documentation that he has had a trial of Lipitor at 40mg  or greater or requires an LDL lowering of >55%. Please advise.

## 2011-07-20 NOTE — Telephone Encounter (Signed)
Per dr Lovell Sheehan- change to lipitor 40-- talked with pt and he he has several months of samples to use and will call us when they run out and we will order lipitor

## 2011-09-28 ENCOUNTER — Other Ambulatory Visit

## 2011-10-06 ENCOUNTER — Encounter: Admitting: Internal Medicine

## 2011-11-28 ENCOUNTER — Other Ambulatory Visit

## 2011-12-05 ENCOUNTER — Encounter: Admitting: Internal Medicine

## 2011-12-26 ENCOUNTER — Other Ambulatory Visit (INDEPENDENT_AMBULATORY_CARE_PROVIDER_SITE_OTHER)

## 2011-12-26 DIAGNOSIS — Z0000 Encounter for general adult medical examination without abnormal findings: Secondary | ICD-10-CM

## 2011-12-26 LAB — BASIC METABOLIC PANEL
CO2: 25 mEq/L (ref 19–32)
Calcium: 9.7 mg/dL (ref 8.4–10.5)
Sodium: 140 mEq/L (ref 135–145)

## 2011-12-26 LAB — CBC WITH DIFFERENTIAL/PLATELET
Basophils Relative: 0.7 % (ref 0.0–3.0)
Eosinophils Absolute: 0.3 10*3/uL (ref 0.0–0.7)
Eosinophils Relative: 5.9 % — ABNORMAL HIGH (ref 0.0–5.0)
Hemoglobin: 15 g/dL (ref 13.0–17.0)
Lymphocytes Relative: 47.8 % — ABNORMAL HIGH (ref 12.0–46.0)
MCHC: 33.6 g/dL (ref 30.0–36.0)
Monocytes Relative: 6.2 % (ref 3.0–12.0)
Neutro Abs: 2 10*3/uL (ref 1.4–7.7)
RBC: 4.42 Mil/uL (ref 4.22–5.81)
WBC: 5.2 10*3/uL (ref 4.5–10.5)

## 2011-12-26 LAB — POCT URINALYSIS DIPSTICK
Bilirubin, UA: NEGATIVE
Glucose, UA: NEGATIVE
Ketones, UA: NEGATIVE
Leukocytes, UA: NEGATIVE
Nitrite, UA: NEGATIVE

## 2011-12-26 LAB — HEPATIC FUNCTION PANEL
ALT: 38 U/L (ref 0–53)
AST: 33 U/L (ref 0–37)
Albumin: 4.2 g/dL (ref 3.5–5.2)
Alkaline Phosphatase: 51 U/L (ref 39–117)
Total Protein: 7.7 g/dL (ref 6.0–8.3)

## 2011-12-26 LAB — LIPID PANEL: Total CHOL/HDL Ratio: 5

## 2011-12-26 LAB — PSA: PSA: 4.19 ng/mL — ABNORMAL HIGH (ref 0.10–4.00)

## 2012-01-02 ENCOUNTER — Ambulatory Visit (INDEPENDENT_AMBULATORY_CARE_PROVIDER_SITE_OTHER): Admitting: Internal Medicine

## 2012-01-02 ENCOUNTER — Encounter: Payer: Self-pay | Admitting: Internal Medicine

## 2012-01-02 VITALS — BP 122/82 | HR 72 | Temp 98.0°F | Resp 16 | Ht 75.0 in | Wt 270.0 lb

## 2012-01-02 DIAGNOSIS — J309 Allergic rhinitis, unspecified: Secondary | ICD-10-CM

## 2012-01-02 DIAGNOSIS — N410 Acute prostatitis: Secondary | ICD-10-CM

## 2012-01-02 DIAGNOSIS — Z0000 Encounter for general adult medical examination without abnormal findings: Secondary | ICD-10-CM

## 2012-01-02 DIAGNOSIS — N138 Other obstructive and reflux uropathy: Secondary | ICD-10-CM

## 2012-01-02 DIAGNOSIS — N401 Enlarged prostate with lower urinary tract symptoms: Secondary | ICD-10-CM

## 2012-01-02 DIAGNOSIS — N139 Obstructive and reflux uropathy, unspecified: Secondary | ICD-10-CM

## 2012-01-02 DIAGNOSIS — J302 Other seasonal allergic rhinitis: Secondary | ICD-10-CM

## 2012-01-02 MED ORDER — CIPROFLOXACIN HCL 500 MG PO TABS: 500.0000 mg | ORAL_TABLET | Freq: Two times a day (BID) | ORAL | Status: AC

## 2012-01-02 NOTE — Patient Instructions (Signed)
Take the antibiotic for the next 3 weeks

## 2012-01-02 NOTE — Progress Notes (Signed)
Subjective:    Patient ID: Justin Winters, male    DOB: Oct 01, 1950, 61 y.o.   MRN: [id number]  HPI presents for his yearly examination.  He has an acute complaint of mild to moderate fatigue and lack of endurance he does have some seasonal allergies he is to do for hyperlipidemia he has a history of PTSD on medication he has a history of sleep apnea but is using his machine.  He also has mild to moderate increased urinary frequency    Review of Systems  Constitutional: Negative for fever and fatigue.  HENT: Negative for hearing loss, congestion, neck pain and postnasal drip.   Eyes: Negative for discharge, redness and visual disturbance.  Respiratory: Negative for cough, shortness of breath and wheezing.   Cardiovascular: Negative for leg swelling.  Gastrointestinal: Negative for abdominal pain, constipation and abdominal distention.  Genitourinary: Negative for urgency and frequency.  Musculoskeletal: Negative for joint swelling and arthralgias.  Skin: Negative for color change and rash.  Neurological: Negative for weakness and light-headedness.  Hematological: Negative for adenopathy.  Psychiatric/Behavioral: Negative for behavioral problems.   Past Medical History  Diagnosis Date  . Sleep apnea   . Groin strain   . Post-traumatic stress syndrome   . Groin strain   . Hyperlipidemia   . Hypogonadism male     History   Social History  . Marital Status: Married    Spouse Name: N/A    Number of Children: N/A  . Years of Education: N/A   Occupational History  . retired    Social History Main Topics  . Smoking status: Never Smoker   . Smokeless tobacco: Not on file  . Alcohol Use: No  . Drug Use: No  . Sexually Active: Yes   Other Topics Concern  . Not on file   Social History Narrative  . No narrative on file    No past surgical history on file.  Family History  Problem Relation Age of Onset  . Dementia Mother   . Alzheimer's disease Mother   . Heart  disease Father   . Hyperlipidemia    . Hypertension Sister     No Known Allergies  Current Outpatient Prescriptions on File Prior to Visit  Medication Sig Dispense Refill  . ACETAMINOPHEN-BUTALBITAL (CEPHADYN) 50-650 MG TABS Take by mouth 3 (three) times daily as needed.        . naproxen (NAPROSYN) 500 MG tablet Take 500 mg by mouth 2 (two) times daily with a meal.        . Omega-3 Fatty Acids (FISH OIL) 1000 MG CAPS Take 2 capsules by mouth 2 (two) times daily.        . rosuvastatin (CRESTOR) 20 MG tablet Take 1 tablet (20 mg total) by mouth as directed. Three time a week  30 tablet  11  . tadalafil (CIALIS) 5 MG tablet Take 5 mg by mouth daily as needed.          BP 122/82  Pulse 72  Temp 98 F (36.7 C)  Resp 16  Ht 6\' 3"  (1.905 m)  Wt 270 lb (122.471 kg)  BMI 33.75 kg/m2       Past Medical History  Diagnosis Date  . Sleep apnea   . Groin strain   . Post-traumatic stress syndrome   . Groin strain   . Hyperlipidemia   . Hypogonadism male     History   Social History  . Marital Status: Married    Spouse Name: N/A  Number of Children: N/A  . Years of Education: N/A   Occupational History  . retired    Social History Main Topics  . Smoking status: Never Smoker   . Smokeless tobacco: Not on file  . Alcohol Use: No  . Drug Use: No  . Sexually Active: Yes   Other Topics Concern  . Not on file   Social History Narrative  . No narrative on file    No past surgical history on file.  Family History  Problem Relation Age of Onset  . Dementia Mother   . Alzheimer's disease Mother   . Heart disease Father   . Hyperlipidemia    . Hypertension Sister     No Known Allergies  Current Outpatient Prescriptions on File Prior to Visit  Medication Sig Dispense Refill  . ACETAMINOPHEN-BUTALBITAL (CEPHADYN) 50-650 MG TABS Take by mouth 3 (three) times daily as needed.        . naproxen (NAPROSYN) 500 MG tablet Take 500 mg by mouth 2 (two) times daily with a  meal.        . Omega-3 Fatty Acids (FISH OIL) 1000 MG CAPS Take 2 capsules by mouth 2 (two) times daily.        . rosuvastatin (CRESTOR) 20 MG tablet Take 1 tablet (20 mg total) by mouth as directed. Three time a week  30 tablet  11  . tadalafil (CIALIS) 5 MG tablet Take 5 mg by mouth daily as needed.          BP 122/82  Pulse 72  Temp 98 F (36.7 C)  Resp 16  Ht 6\' 3"  (1.905 m)  Wt 270 lb (122.471 kg)  BMI 33.75 kg/m2    Objective:   Physical Exam  Nursing note and vitals reviewed. Constitutional: He is oriented to person, place, and time. He appears well-developed and well-nourished.  HENT:  Head: Normocephalic and atraumatic.  Eyes: Conjunctivae are normal. Pupils are equal, round, and reactive to light.  Neck: Normal range of motion. Neck supple.  Cardiovascular: Normal rate and regular rhythm.   Pulmonary/Chest: Effort normal and breath sounds normal.  Abdominal: Soft. Bowel sounds are normal.  Genitourinary: Rectum normal.       Boggy prostate enlarged post to  Musculoskeletal: Normal range of motion.  Neurological: He is alert and oriented to person, place, and time.  Skin: Skin is warm and dry.          Assessment & Plan:   Patient presents for yearly preventative medicine examination.   all immunizations and health maintenance protocols were reviewed with the patient and they are up to date with these protocols.   screening laboratory values were reviewed with the patient including screening of hyperlipidemia PSA renal function and hepatic function.   There medications past medical history social history problem list and allergies were reviewed in detail.   Goals were established with regard to weight loss exercise diet in compliance with medications  Patient has acute prostatitis with elevated PSA and some benign prostatic hypertrophy symptoms we'll treat him with an antibiotic ciprofloxacin 500 mg by mouth twice a day for 21 days we will monitor a PSA in 6  weeks with a free and total to assure that we have normalized his PSA velocity.  He has mild to moderate fatigue that may be related to this infection he has no other explanations possible from his blood work and that his thyroid and his blood counts are stable and he is exercising on  a regular basis.  He has no findings of labile hypertension or abnormal shortness of breath on physical examination including asthma he does have some mild allergies which also may contribute to a sense of fatigue

## 2012-03-06 ENCOUNTER — Other Ambulatory Visit (INDEPENDENT_AMBULATORY_CARE_PROVIDER_SITE_OTHER)

## 2012-03-06 DIAGNOSIS — N400 Enlarged prostate without lower urinary tract symptoms: Secondary | ICD-10-CM

## 2012-03-06 DIAGNOSIS — N410 Acute prostatitis: Secondary | ICD-10-CM

## 2012-03-12 ENCOUNTER — Ambulatory Visit (INDEPENDENT_AMBULATORY_CARE_PROVIDER_SITE_OTHER): Admitting: Internal Medicine

## 2012-03-12 ENCOUNTER — Encounter: Payer: Self-pay | Admitting: Internal Medicine

## 2012-03-12 VITALS — BP 140/86 | HR 76 | Temp 98.6°F | Resp 16 | Ht 74.0 in | Wt 270.0 lb

## 2012-03-12 DIAGNOSIS — R972 Elevated prostate specific antigen [PSA]: Secondary | ICD-10-CM

## 2012-03-12 DIAGNOSIS — N400 Enlarged prostate without lower urinary tract symptoms: Secondary | ICD-10-CM

## 2012-03-12 NOTE — Patient Instructions (Signed)
The patient is instructed to continue all medications as prescribed. Schedule followup with check out clerk upon leaving the clinic  

## 2012-03-12 NOTE — Progress Notes (Signed)
  Subjective:    Patient ID: Justin Winters, male    DOB: Jun 04, 1951, 61 y.o.   MRN: [id number]  HPI  His is a 61 year old male who was noted to have an elevated PSA at the time of his physical he has had PSA for the past 2 years about 3.0 it was increased to 4.9 he was treated with a course of ciprofloxacin and there is a continued increase in the PSA with a free PSA of 11%.  We discussed in detail the next that was a referral to urology and consideration for quadrant biopsies.  We'll refer him to  urology Dr. Bjorn Pippin  Review of Systems  Constitutional: Negative for fever and fatigue.  HENT: Negative for hearing loss, congestion, neck pain and postnasal drip.   Eyes: Negative for discharge, redness and visual disturbance.  Respiratory: Negative for cough, shortness of breath and wheezing.   Cardiovascular: Negative for leg swelling.  Gastrointestinal: Negative for abdominal pain, constipation and abdominal distention.  Genitourinary: Negative for urgency and frequency.  Musculoskeletal: Negative for joint swelling and arthralgias.  Skin: Negative for color change and rash.  Neurological: Negative for weakness and light-headedness.  Hematological: Negative for adenopathy.  Psychiatric/Behavioral: Negative for behavioral problems.       Objective:   Physical Exam  Constitutional: He appears well-developed and well-nourished.  HENT:  Head: Normocephalic and atraumatic.  Eyes: Conjunctivae are normal. Pupils are equal, round, and reactive to light.  Neck: Normal range of motion. Neck supple.  Cardiovascular: Normal rate and regular rhythm.   Pulmonary/Chest: Effort normal and breath sounds normal.  Abdominal: Soft. Bowel sounds are normal.          Assessment & Plan:

## 2012-05-02 DIAGNOSIS — C61 Malignant neoplasm of prostate: Secondary | ICD-10-CM

## 2012-05-02 HISTORY — DX: Malignant neoplasm of prostate: C61

## 2012-05-29 ENCOUNTER — Encounter: Payer: Self-pay | Admitting: *Deleted

## 2012-05-30 ENCOUNTER — Ambulatory Visit
Admission: RE | Admit: 2012-05-30 | Discharge: 2012-05-30 | Disposition: A | Discharge status: Home / Self Care | Source: Ambulatory Visit | Attending: Radiation Oncology | Admitting: Radiation Oncology

## 2012-05-30 ENCOUNTER — Encounter: Payer: Self-pay | Admitting: Radiation Oncology

## 2012-05-30 VITALS — BP 134/89 | HR 86 | Temp 98.4°F | Resp 20 | Ht 75.0 in | Wt 282.7 lb

## 2012-05-30 DIAGNOSIS — C61 Malignant neoplasm of prostate: Secondary | ICD-10-CM

## 2012-05-30 NOTE — Progress Notes (Signed)
Pt interested in radioactive seed implantation. IPSS score today 13  PSA 6/13 5.33 PSA 2011 3.0  Married, retired Hotel manager, 2 children

## 2012-05-30 NOTE — Progress Notes (Signed)
Please see the Nurse Progress Note in the MD Initial Consult Encounter for this patient. 

## 2012-05-30 NOTE — Progress Notes (Signed)
Radiation Oncology         [phone]) [phone] ________________________________  Initial outpatient Consultation  Name: Justin Winters MRN: [id number]  Date: 05/30/2012  DOB: [birth year]  WU:[id number],NWGN EDWARD, MD  Stacie Glaze, MD   REFERRING PHYSICIAN: Stacie Glaze, MD  DIAGNOSIS: 61 yo man with stage T1c adenocarcinoma of the prostate with a Gleason's score of 3+3 and a PSA of 5.33  HISTORY OF PRESENT ILLNESS::Justin Winters is a 61 y.o. gentleman.  He was noted to have an elevated PSA of 4.9 by his primary care physician, Dr. Lovell Sheehan.  Accordingly, he was referred for evaluation in urology by Dr. Vernie Ammons on 03/3012,  digital rectal examination was performed at that time revealing a 1+ prostate with Winters nodules.  The patient proceeded to transrectal ultrasound with 12 biopsies of the prostate on 05/02/12.  The prostate volume measured 54 cc.  Out of 12 core biopsies,8 were positive.  The maximum Gleason score was 3+3, and this was seen in 5 right sided specimens and 3 left sided specimens.  The patient reviewed the biopsy results with his urologist and he has kindly been referred today for discussion of potential radiation treatment options. Marland Kitchen  PREVIOUS RADIATION THERAPY: Winters  PAST MEDICAL HISTORY:  has a past medical history of Sleep apnea; Groin strain; Post-traumatic stress syndrome; Groin strain; Hyperlipidemia; Hypogonadism male; Depression; Anxiety; Arthritis; and Prostate cancer (05/02/12).    PAST SURGICAL HISTORY:Winters past surgical history on file.  FAMILY HISTORY: family history includes Alzheimer's disease in his mother; Dementia in his mother; Heart disease in his father; Hyperlipidemia in an unspecified family member; and Hypertension in his sister.  SOCIAL HISTORY:  reports that he has never smoked. He does not have any smokeless tobacco history on file. He reports that he does not drink alcohol or use illicit drugs.  ALLERGIES: Review of patient's allergies indicates Winters known  allergies.  MEDICATIONS:  Current Outpatient Prescriptions  Medication Sig Dispense Refill  . ACETAMINOPHEN-BUTALBITAL (CEPHADYN) 50-650 MG TABS Take by mouth 3 (three) times daily as needed.        Marland Kitchen CITALOPRAM HYDROBROMIDE PO Take 40 mg by mouth.      . naproxen (NAPROSYN) 500 MG tablet Take 500 mg by mouth 2 (two) times daily with a meal.        . Omega-3 Fatty Acids (FISH OIL) 1000 MG CAPS Take 2 capsules by mouth 2 (two) times daily.        . rosuvastatin (CRESTOR) 20 MG tablet Take 1 tablet (20 mg total) by mouth as directed. Three time a week  30 tablet  11  . tadalafil (CIALIS) 5 MG tablet Take 5 mg by mouth daily as needed.          REVIEW OF SYSTEMS:  A 15 point review of systems is documented in the electronic medical record. This was obtained by the nursing staff. However, I reviewed this with the patient to discuss relevant findings and make appropriate changes.  A comprehensive review of systems was negative.  IPSS score today was 13 consistent with moderate obstructive symptoms, although he does note nocturia up to 4 times nightly.  His erectile function questionnaire inidcates that he is able to achieve an erection capable of completion of intercourse about half the time and uses Cialis prn.   PHYSICAL EXAM:  The patient is in Winters acute distress today. Is alert and oriented.  Filed Weights   05/30/12 1535  Weight: 282 lb 11.2 oz (128.232 kg)  Filed Vitals:   05/30/12 1535  BP: 134/89  Pulse: 86  Temp: 98.4 F (36.9 C)  Resp: 20   Further detailed physical exams deferred today. Please note that Dr. Margrett Rud rectal exam characterize the patient's staging is T1c  LABORATORY DATA:  Lab Results  Component Value Date   WBC 5.2 12/26/2011   HGB 15.0 12/26/2011   HCT 44.7 12/26/2011   MCV 101.0* 12/26/2011   PLT 164.0 12/26/2011   Lab Results  Component Value Date   NA 140 12/26/2011   K 4.9 12/26/2011   CL 106 12/26/2011   CO2 25 12/26/2011   Lab Results  Component  Value Date   ALT 38 12/26/2011   AST 33 12/26/2011   ALKPHOS 51 12/26/2011   BILITOT 1.9* 12/26/2011     RADIOGRAPHY: Winters results found.    IMPRESSION: Mr. Pickel is a very nice 61 year-old man with stage T1c adenocarcinoma of the prostate with a Gleason's score of 3+3 and a PSA of 5.33. He falls into a favorable risk group making him eligible for active surveillance, prostatectomy, IMRT, or seed implant with a high likelihood for cure and excellent survival.  PLAN:Today I reviewed the findings and workup thus far.  We discussed the natural history of prostate cancer.  We reviewed the the implications of T-stage, Gleason's Score, and PSA on decision-making and outcomes in prostate cancer.  We discussed radiation treatment in the management of prostate cancer with regard to the logistics and delivery of external beam radiation treatment as well as the logistics and delivery of prostate brachytherapy.  We compared and contrasted each of these approaches and also compared these against prostatectomy.  The patient expressed interest in prostate brachytherapy.  I filled out a patient counseling form for him with relevant treatment diagrams and we retained a copy for our records.   The patient would like to proceed with prostate brachytherapy.  I will share my findings with Dr. Vernie Ammons and move forward with scheduling the procedure in the near future.     I enjoyed meeting with him today, and will look forward to participating in the care of this very nice gentleman.    I spent 60 minutes minutes face to face with the patient and more than 50% of that time was spent in counseling and/or coordination of care.   ------------------------------------------------  Artist Pais. Kathrynn Running, M.D.

## 2012-05-31 NOTE — Addendum Note (Signed)
Encounter addended by: Glennie Hawk, RN on: 05/31/2012  8:47 AM<BR>     Documentation filed: Charges VN

## 2012-06-04 ENCOUNTER — Telehealth: Payer: Self-pay | Admitting: *Deleted

## 2012-06-04 NOTE — Telephone Encounter (Signed)
CALLED PATIENT TO INFORM OF PRE-SEED SCAN FOR 06-08-12 AT 2:00 PM, SPOKE WITH PATIENT'S WIFE VICKIE AND SHE IS AWARE  OF THIS APPT.

## 2012-06-04 NOTE — Telephone Encounter (Signed)
XXXXX

## 2012-06-06 ENCOUNTER — Telehealth: Payer: Self-pay | Admitting: *Deleted

## 2012-06-06 ENCOUNTER — Other Ambulatory Visit: Payer: Self-pay | Admitting: Urology

## 2012-06-06 NOTE — Telephone Encounter (Signed)
CALLED PATIENT TO ALTER PRE-SEED APPT. FOR 06-08-12, DUE TO SUSAN BOYLES NEEDING A SIM TIME- LVM FOR A RETURN CALL

## 2012-06-07 ENCOUNTER — Telehealth: Payer: Self-pay | Admitting: *Deleted

## 2012-06-07 NOTE — Telephone Encounter (Signed)
CALLED PATIENT TO REMIND OF APPT. FOR 06-08-12 AT 10:30 AM, SPOKE WITH PATIENT'S WIFE AND THEY ARE AWARE OF THIS APPT.

## 2012-06-08 ENCOUNTER — Ambulatory Visit
Admission: RE | Admit: 2012-06-08 | Discharge: 2012-06-08 | Disposition: A | Discharge status: Home / Self Care | Source: Ambulatory Visit | Attending: Radiation Oncology | Admitting: Radiation Oncology

## 2012-06-08 ENCOUNTER — Encounter (HOSPITAL_BASED_OUTPATIENT_CLINIC_OR_DEPARTMENT_OTHER)
Admission: RE | Admit: 2012-06-08 | Discharge: 2012-06-08 | Disposition: A | Discharge status: Home / Self Care | Source: Ambulatory Visit | Attending: Urology | Admitting: Urology

## 2012-06-08 ENCOUNTER — Ambulatory Visit (HOSPITAL_BASED_OUTPATIENT_CLINIC_OR_DEPARTMENT_OTHER)
Admission: RE | Admit: 2012-06-08 | Discharge: 2012-06-08 | Disposition: A | Discharge status: Home / Self Care | Source: Ambulatory Visit | Attending: Urology | Admitting: Urology

## 2012-06-08 ENCOUNTER — Encounter: Payer: Self-pay | Admitting: Radiation Oncology

## 2012-06-08 DIAGNOSIS — Z01818 Encounter for other preprocedural examination: Secondary | ICD-10-CM | POA: Insufficient documentation

## 2012-06-08 DIAGNOSIS — Q254 Congenital malformation of aorta unspecified: Secondary | ICD-10-CM | POA: Insufficient documentation

## 2012-06-08 DIAGNOSIS — R9431 Abnormal electrocardiogram [ECG] [EKG]: Secondary | ICD-10-CM | POA: Insufficient documentation

## 2012-06-08 DIAGNOSIS — I517 Cardiomegaly: Secondary | ICD-10-CM | POA: Insufficient documentation

## 2012-06-08 DIAGNOSIS — C61 Malignant neoplasm of prostate: Secondary | ICD-10-CM

## 2012-06-08 NOTE — Progress Notes (Signed)
  Radiation Oncology         [phone] ________________________________  Name: Justin Winters MRN: [id number]  Date: 06/08/2012  DOB: [birth year]  SIMULATION AND TREATMENT PLANNING NOTE PUBIC ARCH STUDY  AO:[id number],QION Ramon Dredge, MD  Stacie Glaze, MD  DIAGNOSIS: 61 yo man with stage T1c adenocarcinoma of the prostate with a Gleason's score of 3+3 and a PSA of 5.33  COMPLEX SIMULATION:  The patient presented today for evaluation for possible prostate seed implant. He was brought to the radiation planning suite and placed supine on the CT couch. A 3-dimensional image study set was obtained in upload to the planning computer. There, on each axial slice, I contoured the prostate gland. Then, using three-dimensional radiation planning tools I reconstructed the prostate in view of the structures from the transperineal needle pathway to assess for possible pubic arch interference. In doing so, I did not appreciate any pubic arch interference. Also, the patient's prostate volume was estimated based on the drawn structure. The volume was 58 cc.  Given the pubic arch appearance and prostate volume, patient remains a good candidate to proceed with prostate seed implant. Today, he freely provided informed written consent to proceed.    PLAN: The patient will undergo prostate seed implant.   ________________________________  Artist Pais. Kathrynn Running, M.D.

## 2012-06-11 ENCOUNTER — Ambulatory Visit: Admitting: Radiation Oncology

## 2012-06-11 ENCOUNTER — Ambulatory Visit

## 2012-06-11 NOTE — Addendum Note (Signed)
Encounter addended by: Delynn Flavin, RN on: 06/11/2012  7:37 PM<BR>     Documentation filed: Charges VN

## 2012-06-18 ENCOUNTER — Ambulatory Visit: Admitting: Internal Medicine

## 2012-07-02 ENCOUNTER — Encounter: Payer: Self-pay | Admitting: Internal Medicine

## 2012-07-02 ENCOUNTER — Ambulatory Visit (INDEPENDENT_AMBULATORY_CARE_PROVIDER_SITE_OTHER): Admitting: Internal Medicine

## 2012-07-02 VITALS — BP 120/80 | HR 76 | Temp 98.2°F | Resp 16 | Ht 75.0 in | Wt 280.0 lb

## 2012-07-02 DIAGNOSIS — R51 Headache: Secondary | ICD-10-CM

## 2012-07-02 DIAGNOSIS — R030 Elevated blood-pressure reading, without diagnosis of hypertension: Secondary | ICD-10-CM

## 2012-07-02 DIAGNOSIS — G4730 Sleep apnea, unspecified: Secondary | ICD-10-CM

## 2012-07-02 DIAGNOSIS — Z23 Encounter for immunization: Secondary | ICD-10-CM

## 2012-07-02 NOTE — Patient Instructions (Signed)
The patient is instructed to continue all medications as prescribed. Schedule followup with check out clerk upon leaving the clinic  

## 2012-07-02 NOTE — Progress Notes (Signed)
  Subjective:    Patient ID: Justin Winters, male    DOB: 06/30/1951, 61 y.o.   MRN: [id number]  HPI Discussion of gluten free diet Seed implant scheduled for early stage prostate cancer reviewed lipid   Review of Systems  Constitutional: Negative for fever and fatigue.  HENT: Negative for hearing loss, congestion, neck pain and postnasal drip.   Eyes: Negative for discharge, redness and visual disturbance.  Respiratory: Negative for cough, shortness of breath and wheezing.   Cardiovascular: Negative for leg swelling.  Gastrointestinal: Negative for abdominal pain, constipation and abdominal distention.  Genitourinary: Negative for urgency and frequency.  Musculoskeletal: Negative for joint swelling and arthralgias.  Skin: Negative for color change and rash.  Neurological: Negative for weakness and light-headedness.  Hematological: Negative for adenopathy.  Psychiatric/Behavioral: Negative for behavioral problems.       Objective:   Physical Exam  Nursing note and vitals reviewed. Constitutional: He appears well-developed and well-nourished.  HENT:  Head: Normocephalic and atraumatic.  Eyes: Conjunctivae normal are normal. Pupils are equal, round, and reactive to light.  Neck: Normal range of motion. Neck supple.  Cardiovascular: Normal rate and regular rhythm.   Pulmonary/Chest: Effort normal and breath sounds normal.  Abdominal: Soft. Bowel sounds are normal.          Assessment & Plan:  Discussion of prostate cancer and implantation of radioactive seeds for treatment.  Blood pressure is stable without evidence of hypertension.  Discussion of weight loss obesity and the plan for a gluten-free diet for treatment of his morbid obesity.  Discussion of hyperlipidemia continuation of Crestor

## 2012-07-12 ENCOUNTER — Telehealth: Payer: Self-pay | Admitting: *Deleted

## 2012-07-12 NOTE — Telephone Encounter (Signed)
CALLED PATIENT TO REMIND OF APPT., LVM FOR A RETURN CALL 

## 2012-07-13 LAB — COMPREHENSIVE METABOLIC PANEL
ALT: 30 U/L (ref 0–53)
AST: 27 U/L (ref 0–37)
Albumin: 3.6 g/dL (ref 3.5–5.2)
Alkaline Phosphatase: 56 U/L (ref 39–117)
BUN: 15 mg/dL (ref 6–23)
CO2: 27 mEq/L (ref 19–32)
Calcium: 9.6 mg/dL (ref 8.4–10.5)
Chloride: 102 mEq/L (ref 96–112)
Creatinine, Ser: 1.19 mg/dL (ref 0.50–1.35)
GFR calc Af Amer: 76 mL/min — ABNORMAL LOW (ref >90)
GFR calc non Af Amer: 65 mL/min — ABNORMAL LOW (ref >90)
Glucose, Bld: 100 mg/dL — ABNORMAL HIGH (ref 70–99)
Potassium: 4.2 mEq/L (ref 3.5–5.1)
Sodium: 137 mEq/L (ref 135–145)
Total Bilirubin: 1 mg/dL (ref 0.3–1.2)
Total Protein: 7.7 g/dL (ref 6.0–8.3)

## 2012-07-13 LAB — CBC
HCT: 44.3 % (ref 39.0–52.0)
Hemoglobin: 15 g/dL (ref 13.0–17.0)
MCH: 33.8 pg (ref 26.0–34.0)
MCHC: 33.9 g/dL (ref 30.0–36.0)
MCV: 99.8 fL (ref 78.0–100.0)
Platelets: 171 10*3/uL (ref 150–400)
RBC: 4.44 MIL/uL (ref 4.22–5.81)
RDW: 12.4 % (ref 11.5–15.5)
WBC: 4.5 10*3/uL (ref 4.0–10.5)

## 2012-07-13 LAB — PROTIME-INR
INR: 0.92 (ref 0.00–1.49)
Prothrombin Time: 12.3 seconds (ref 11.6–15.2)

## 2012-07-13 LAB — APTT: aPTT: 36 seconds (ref 24–37)

## 2012-07-17 ENCOUNTER — Encounter (HOSPITAL_BASED_OUTPATIENT_CLINIC_OR_DEPARTMENT_OTHER): Payer: Self-pay | Admitting: *Deleted

## 2012-07-17 NOTE — Progress Notes (Signed)
Spoke w wife. Instructions given for pt to be npo p mn 11/7.  To [facility] 11/8 @ 0845.  Labs cxr, ekg in epic.  Reminded to do fleets enema am of surgery.  Wife to call back w list of meds and weight.

## 2012-07-18 ENCOUNTER — Encounter (HOSPITAL_BASED_OUTPATIENT_CLINIC_OR_DEPARTMENT_OTHER): Payer: Self-pay | Admitting: *Deleted

## 2012-07-19 ENCOUNTER — Telehealth: Payer: Self-pay | Admitting: *Deleted

## 2012-07-19 NOTE — Telephone Encounter (Signed)
Called patient to remind of procedure for 07-20-12, spoke with patient's wife Chip Boer and they are aware of this procedure.

## 2012-07-20 ENCOUNTER — Encounter (HOSPITAL_BASED_OUTPATIENT_CLINIC_OR_DEPARTMENT_OTHER)
Admission: RE | Disposition: A | Discharge status: Home / Self Care | Payer: Self-pay | Source: Ambulatory Visit | Attending: Urology

## 2012-07-20 ENCOUNTER — Encounter (HOSPITAL_BASED_OUTPATIENT_CLINIC_OR_DEPARTMENT_OTHER): Payer: Self-pay | Admitting: Anesthesiology

## 2012-07-20 ENCOUNTER — Encounter: Payer: Self-pay | Admitting: Radiation Oncology

## 2012-07-20 ENCOUNTER — Ambulatory Visit (HOSPITAL_BASED_OUTPATIENT_CLINIC_OR_DEPARTMENT_OTHER): Admitting: Anesthesiology

## 2012-07-20 ENCOUNTER — Encounter (HOSPITAL_BASED_OUTPATIENT_CLINIC_OR_DEPARTMENT_OTHER): Payer: Self-pay | Admitting: *Deleted

## 2012-07-20 ENCOUNTER — Ambulatory Visit (HOSPITAL_COMMUNITY)

## 2012-07-20 ENCOUNTER — Ambulatory Visit (HOSPITAL_BASED_OUTPATIENT_CLINIC_OR_DEPARTMENT_OTHER)
Admission: RE | Admit: 2012-07-20 | Discharge: 2012-07-20 | Disposition: A | Discharge status: Home / Self Care | Source: Ambulatory Visit | Attending: Urology | Admitting: Urology

## 2012-07-20 DIAGNOSIS — Z923 Personal history of irradiation: Secondary | ICD-10-CM

## 2012-07-20 DIAGNOSIS — G4730 Sleep apnea, unspecified: Secondary | ICD-10-CM | POA: Insufficient documentation

## 2012-07-20 DIAGNOSIS — E7800 Pure hypercholesterolemia, unspecified: Secondary | ICD-10-CM | POA: Insufficient documentation

## 2012-07-20 DIAGNOSIS — C61 Malignant neoplasm of prostate: Secondary | ICD-10-CM

## 2012-07-20 DIAGNOSIS — Z79899 Other long term (current) drug therapy: Secondary | ICD-10-CM | POA: Insufficient documentation

## 2012-07-20 HISTORY — DX: Personal history of irradiation: Z92.3

## 2012-07-20 HISTORY — PX: RADIOACTIVE SEED IMPLANT: SHX5150

## 2012-07-20 SURGERY — INSERTION, RADIATION SOURCE, PROSTATE
Anesthesia: General | Site: Prostate | Wound class: Clean Contaminated

## 2012-07-20 MED ORDER — LACTATED RINGERS IV SOLN
INTRAVENOUS | Status: DC
  Administered 2012-07-20 (×3): via INTRAVENOUS
  Filled 2012-07-20: qty 1000

## 2012-07-20 MED ORDER — CIPROFLOXACIN HCL 500 MG PO TABS: 500.0000 mg | ORAL_TABLET | Freq: Two times a day (BID) | ORAL | Status: DC

## 2012-07-20 MED ORDER — ONDANSETRON HCL 4 MG/2ML IJ SOLN
INTRAMUSCULAR | Status: DC | PRN
  Administered 2012-07-20: 4 mg via INTRAVENOUS

## 2012-07-20 MED ORDER — FENTANYL CITRATE 0.05 MG/ML IJ SOLN
INTRAMUSCULAR | Status: DC | PRN
  Administered 2012-07-20 (×7): 25 ug via INTRAVENOUS
  Administered 2012-07-20: 50 ug via INTRAVENOUS
  Administered 2012-07-20 (×5): 25 ug via INTRAVENOUS

## 2012-07-20 MED ORDER — IOHEXOL 350 MG/ML SOLN
INTRAVENOUS | Status: DC | PRN
  Administered 2012-07-20: 3 mL

## 2012-07-20 MED ORDER — MIDAZOLAM HCL 5 MG/5ML IJ SOLN
INTRAMUSCULAR | Status: DC | PRN
  Administered 2012-07-20: 2 mg via INTRAVENOUS

## 2012-07-20 MED ORDER — STERILE WATER FOR IRRIGATION IR SOLN
Status: DC | PRN
  Administered 2012-07-20: 3000 mL via INTRAVESICAL

## 2012-07-20 MED ORDER — HYDROCODONE-ACETAMINOPHEN 7.5-325 MG PO TABS: 1.0000 | ORAL_TABLET | ORAL | Status: DC | PRN

## 2012-07-20 MED ORDER — FENTANYL CITRATE 0.05 MG/ML IJ SOLN
25.0000 ug | INTRAMUSCULAR | Status: DC | PRN
  Filled 2012-07-20: qty 1

## 2012-07-20 MED ORDER — CIPROFLOXACIN IN D5W 400 MG/200ML IV SOLN
400.0000 mg | INTRAVENOUS | Status: AC
  Administered 2012-07-20: 400 mg via INTRAVENOUS
  Filled 2012-07-20: qty 200

## 2012-07-20 MED ORDER — HYDROCODONE-ACETAMINOPHEN 7.5-325 MG PO TABS
1.0000 | ORAL_TABLET | ORAL | Status: DC | PRN
  Administered 2012-07-20: 1 via ORAL
  Filled 2012-07-20: qty 2

## 2012-07-20 MED ORDER — DEXAMETHASONE SODIUM PHOSPHATE 4 MG/ML IJ SOLN
INTRAMUSCULAR | Status: DC | PRN
  Administered 2012-07-20: 8 mg via INTRAVENOUS

## 2012-07-20 MED ORDER — FLEET ENEMA 7-19 GM/118ML RE ENEM
1.0000 | ENEMA | Freq: Once | RECTAL | Status: DC
  Filled 2012-07-20: qty 1

## 2012-07-20 MED ORDER — PROMETHAZINE HCL 25 MG/ML IJ SOLN
6.2500 mg | INTRAMUSCULAR | Status: DC | PRN
  Filled 2012-07-20: qty 1

## 2012-07-20 MED ORDER — PROPOFOL 10 MG/ML IV BOLUS
INTRAVENOUS | Status: DC | PRN
  Administered 2012-07-20: 270 mg via INTRAVENOUS
  Administered 2012-07-20: 20 mg via INTRAVENOUS
  Administered 2012-07-20: 40 mg via INTRAVENOUS

## 2012-07-20 MED ORDER — LIDOCAINE HCL (CARDIAC) 20 MG/ML IV SOLN
INTRAVENOUS | Status: DC | PRN
  Administered 2012-07-20: 100 mg via INTRAVENOUS

## 2012-07-20 MED ORDER — ACETAMINOPHEN 10 MG/ML IV SOLN
INTRAVENOUS | Status: DC | PRN
  Administered 2012-07-20: 1000 mg via INTRAVENOUS

## 2012-07-20 SURGICAL SUPPLY — 28 items
BAG URINE DRAINAGE (UROLOGICAL SUPPLIES) ×2 IMPLANT
BLADE SURG ROTATE 9660 (MISCELLANEOUS) ×2 IMPLANT
CATH FOLEY 2WAY SLVR  5CC 16FR (CATHETERS) ×2
CATH FOLEY 2WAY SLVR 5CC 16FR (CATHETERS) ×2 IMPLANT
CATH ROBINSON RED A/P 20FR (CATHETERS) ×2 IMPLANT
CLOTH BEACON ORANGE TIMEOUT ST (SAFETY) ×2 IMPLANT
COVER MAYO STAND STRL (DRAPES) ×2 IMPLANT
COVER TABLE BACK 60X90 (DRAPES) ×2 IMPLANT
DRSG TEGADERM 4X4.75 (GAUZE/BANDAGES/DRESSINGS) ×2 IMPLANT
DRSG TEGADERM 8X12 (GAUZE/BANDAGES/DRESSINGS) ×2 IMPLANT
GLOVE BIO SURGEON STRL SZ 6 (GLOVE) ×2 IMPLANT
GLOVE BIO SURGEON STRL SZ 6.5 (GLOVE) ×2 IMPLANT
GLOVE BIO SURGEON STRL SZ7.5 (GLOVE) ×8 IMPLANT
GLOVE BIO SURGEON STRL SZ8 (GLOVE) IMPLANT
GLOVE ECLIPSE 6.5 STRL STRAW (GLOVE) ×2 IMPLANT
GLOVE ECLIPSE 8.0 STRL XLNG CF (GLOVE) IMPLANT
GOWN STRL REIN XL XLG (GOWN DISPOSABLE) ×2 IMPLANT
GOWN XL W/COTTON TOWEL STD (GOWNS) ×2 IMPLANT
HOLDER FOLEY CATH W/STRAP (MISCELLANEOUS) ×2 IMPLANT
IV NS IRRIG 3000ML ARTHROMATIC (IV SOLUTION) IMPLANT
IV WATER IRR. 1000ML (IV SOLUTION) ×2 IMPLANT
PACK CYSTOSCOPY (CUSTOM PROCEDURE TRAY) ×2 IMPLANT
SPONGE GAUZE 4X4 12PLY (GAUZE/BANDAGES/DRESSINGS) ×2 IMPLANT
SYRINGE 10CC LL (SYRINGE) ×2 IMPLANT
UNDERPAD 30X30 INCONTINENT (UNDERPADS AND DIAPERS) ×4 IMPLANT
WATER STERILE IRR 3000ML UROMA (IV SOLUTION) IMPLANT
WATER STERILE IRR 500ML POUR (IV SOLUTION) ×2 IMPLANT
selectseed ×2 IMPLANT

## 2012-07-20 NOTE — Op Note (Signed)
PATIENT:  Justin Winters  PRE-OPERATIVE DIAGNOSIS:  Adenocarcinoma of the prostate  POST-OPERATIVE DIAGNOSIS:  Same  PROCEDURE:  Procedure(s): 1. I-125 radioactive seed implantation 2. Cystoscopy  SURGEON:  Surgeon(s): Garnett Farm  Radiation oncologist: Dr. Chipper Herb  ANESTHESIA:  General  EBL:  Minimal  DRAINS: 16 French Foley catheter  INDICATION: [name] is a 61 yo male with biopsy proven prostate cancer.  Description of procedure: After informed consent the patient was brought to the major OR, placed on the table and administered general anesthesia. He was then moved to the modified lithotomy position with his perineum perpendicular to the floor. His perineum and genitalia were then sterilely prepped. An official timeout was then performed. A 16 French Foley catheter was then placed in the bladder and filled with dilute contrast, a rectal tube was placed in the rectum and the transrectal ultrasound probe was placed in the rectum and affixed to the stand. He was then sterilely draped.  Real time ultrasonography was used along with the seed planning software spot-pro version 3.1-00. This was used to develop the seed plan including the number of needles as well as number of seeds required for complete and adequate coverage. Real-time ultrasonography was then used along with the previously developed plan and the Nucletron device to implant a total of 76 seeds using 27 needles. This proceeded without difficulty or complication.  A Foley catheter was then removed as well as the transrectal ultrasound probe and rectal probe. Flexible cystoscopy was then performed using the 17 French flexible scope which revealed a normal urethra throughout its length down to the sphincter which appeared intact. The prostatic urethra revealed bilobar hypertrophy but no evidence of obstruction, seeds, spacers or lesions. The bladder was then entered and fully and systematically inspected. The  ureteral orifices were noted to be of normal configuration and position. The mucosa revealed no evidence of tumors. There were also no stones identified within the bladder. I noted no seeds or spacers on the floor of the bladder and retroflexion of the scope revealed no seeds protruding from the base of the prostate.  The cystoscope was then removed and a new 16 French Foley catheter was then inserted and the balloon was filled with 10 cc of sterile water. This was connected to closed system drainage and the patient was awakened and taken to recovery room in stable and satisfactory condition. He tolerated procedure well and there were no intraoperative complications.

## 2012-07-20 NOTE — Anesthesia Postprocedure Evaluation (Signed)
  Anesthesia Post-op Note  Patient: Justin Winters  Procedure(s) Performed: Procedure(s) (LRB): RADIOACTIVE SEED IMPLANT (N/A)  Patient Location: PACU  Anesthesia Type: General  Level of Consciousness: awake and alert   Airway and Oxygen Therapy: Patient Spontanous Breathing  Post-op Pain: mild  Post-op Assessment: Post-op Vital signs reviewed, Patient's Cardiovascular Status Stable, Respiratory Function Stable, Patent Airway and No signs of Nausea or vomiting  Post-op Vital Signs: stable  Complications: No apparent anesthesia complications

## 2012-07-20 NOTE — Anesthesia Preprocedure Evaluation (Addendum)
Anesthesia Evaluation  Patient identified by MRN, date of birth, ID band Patient awake    Reviewed: Allergy & Precautions, H&P , NPO status , Patient's Chart, lab work & pertinent test results  Airway Mallampati: II TM Distance: >3 FB Neck ROM: Full    Dental No notable dental hx.    Pulmonary sleep apnea ,  breath sounds clear to auscultation  Pulmonary exam normal       Cardiovascular Exercise Tolerance: Good negative cardio ROS  Rhythm:Regular Rate:Normal  H/O elevated BP without diagnosis of htn. He states his BP is only intermittently elevated. He states his primary care MD is following his BP.   Neuro/Psych  Headaches, PSYCHIATRIC DISORDERS Anxiety Depression  Neuromuscular disease    GI/Hepatic negative GI ROS, Neg liver ROS,   Endo/Other  negative endocrine ROS  Renal/GU negative Renal ROS  negative genitourinary   Musculoskeletal negative musculoskeletal ROS (+)   Abdominal (+) + obese,   Peds negative pediatric ROS (+)  Hematology negative hematology ROS (+)   Anesthesia Other Findings   Reproductive/Obstetrics negative OB ROS                        Anesthesia Physical Anesthesia Plan  ASA: II  Anesthesia Plan: General   Post-op Pain Management:    Induction: Intravenous  Airway Management Planned: LMA  Additional Equipment:   Intra-op Plan:   Post-operative Plan: Extubation in OR  Informed Consent: I have reviewed the patients History and Physical, chart, labs and discussed the procedure including the risks, benefits and alternatives for the proposed anesthesia with the patient or authorized representative who has indicated his/her understanding and acceptance.   Dental advisory given  Plan Discussed with: CRNA  Anesthesia Plan Comments: (Counseled patient to monitor BP for hypertension.)      Anesthesia Quick Evaluation

## 2012-07-20 NOTE — Progress Notes (Signed)
End of treatment summary  Diagnosis: Stage TI C. favorable risk adenocarcinoma prostate  Requesting physician: Dr. Ihor Gully  Intent: Curative  Implant date: 07/20/2012  Site/dose: Prostate 145 Gy  Isotope: I-125 utilizing 76 seeds and 25 active needles with the individual seed activity 0.585 mCi per seed for a total implant activity of 44.46 mCi.  Narrative: The patient appears to have tolerated a Nucletron seed Selectron implant with Dr. Vernie Ammons without complication.  Plan: Followup visit with Dr. Kathrynn Running along with a CT scan for his post implant dosimetry/3-D simulation to assess the quality of his implant in approximately 3 weeks. He'll see Dr. Alexia Freestone in 3 weeks as well.

## 2012-07-20 NOTE — Transfer of Care (Signed)
Immediate Anesthesia Transfer of Care Note  Patient: Justin Winters  Procedure(s) Performed: Procedure(s) (LRB): RADIOACTIVE SEED IMPLANT (N/A)  Patient Location: Patient transported to PACU with oxygen via face mask at 4 Liters / Min  Anesthesia Type: General  Level of Consciousness: awake and alert   Airway & Oxygen Therapy: Patient Spontanous Breathing and Patient connected to face mask oxygen  Post-op Assessment: Report given to PACU RN and Post -op Vital signs reviewed and stable  Post vital signs: Reviewed and stable  Dentition: Teeth and oropharynx remain in pre-op condition  Complications: No apparent anesthesia complications

## 2012-07-20 NOTE — Anesthesia Procedure Notes (Signed)
Procedure Name: LMA Insertion Date/Time: 07/20/2012 10:12 AM Performed by: Maris Berger T Pre-anesthesia Checklist: Patient identified, Emergency Drugs available, Suction available and Patient being monitored Patient Re-evaluated:Patient Re-evaluated prior to inductionOxygen Delivery Method: Circle System Utilized Preoxygenation: Pre-oxygenation with 100% oxygen Intubation Type: IV induction Ventilation: Mask ventilation without difficulty LMA: LMA inserted LMA Size: 4.0 and 5.0 Number of attempts: 1 Airway Equipment and Method: bite block Placement Confirmation: positive ETCO2 Tube secured with: Tape Dental Injury: Teeth and Oropharynx as per pre-operative assessment  Comments: 4 LMA inserted, initially able to ventilated but with leak noted, replaced with 5 LMA with good seal.

## 2012-07-20 NOTE — H&P (Signed)
History of Present Illness         Adenocarcinoma of the prostate: His PSA in 2011 was 3.0. It was then found to have elevated to 4.9 and he was placed on a round of Cipro and in 6/13 his PSA was noted to be 5.33/11%.  He reports that he does have nocturia x2 but with a good force.  He was found to have no abnormality on DRE.   TRUS/BX 05/02/12: Prostate volume - 54 cc  Pathology: Adenocarcinoma Gleason score 3+3 = 6 in 8/12 cores, clinical stage T1c.   Interval history: He is been doing well. No new urologic complaints are noted. He has reviewed information that I had given him.   Past Medical History Problems  1. History of  Anxiety (Symptom) 300.00 2. History of  Arthritis V13.4 3. History of  Depression 311 4. History of  Hypercholesterolemia 272.0 5. History of  Sleep Apnea 780.57  Surgical History Problems  1. History of  Biopsy Of The Prostate Needle 2. History of  No Surgical Problems  Current Meds 1. Butalbital-APAP 50-650 MG CAPS; Therapy: (Recorded:30Jul2013) to 2. Cialis 5 MG Oral Tablet; Therapy: (Recorded:30Jul2013) to 3. Citalopram Hydrobromide 40 MG Oral Tablet; Therapy: (Recorded:30Jul2013) to 4. Crestor 20 MG Oral Tablet; Therapy: (Recorded:30Jul2013) to 5. Naproxen 500 MG Oral Tablet; Therapy: (Recorded:30Jul2013) to 6. Omega 3 CAPS; Therapy: (Recorded:30Jul2013) to  Allergies Medication  1. No Known Drug Allergies  Family History Problems  1. Family history of  Alzheimer's Disease 2. Family history of  Death In The Family Father 3. Family history of  Death In The Family Mother 4. Family history of  Family Health Status Children ___ Living Daughters 1 5. Family history of  Family Health Status Children ___ Living Sons 1 6. Family history of  Heart Disease V17.49 7. Family history of  Hyperlipidemia 8. Family history of  Hypertension V17.49  Social History Problems  1. Caffeine Use 1 soda a day 2. Marital History - Currently Married 3. Never A  Smoker 4. 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 History of  Alcohol Use  Review of Systems Genitourinary, constitutional, skin, eye, otolaryngeal, hematologic/lymphatic, cardiovascular, pulmonary, endocrine, musculoskeletal, gastrointestinal, neurological and psychiatric system(s) were reviewed and pertinent findings if present are noted.  Genitourinary: urinary frequency, feelings of urinary urgency, nocturia and erectile dysfunction.  Constitutional: night sweats and feeling tired (fatigue).  Musculoskeletal: back pain and joint pain.  Neurological: headache and dizziness.  Psychiatric: anxiety and depression.    Vitals Vital Signs BMI Calculated: 33.3 BSA Calculated: 2.47 Height: 6 ft 3 in Weight: 265 lb  Blood Pressure: 141 / 93 Temperature: 97.2 F Heart Rate: 78  Physical Exam Constitutional: Well nourished and well developed . No acute distress.  ENT:. The ears and nose are normal in appearance.  Neck: The appearance of the neck is normal and no neck mass is present.  Pulmonary: No respiratory distress and normal respiratory rhythm and effort.  Cardiovascular: Heart rate and rhythm are normal . No peripheral edema.  Abdomen: The abdomen is soft and nontender. No masses are palpated. No CVA tenderness. No hernias are palpable. No hepatosplenomegaly noted.  Rectal: Rectal exam demonstrates normal sphincter tone, no tenderness and no masses. Estimated prostate size is 1+. The prostate has no nodularity, is indurated involving the entire right lobe  of the prostate and is not tender. The left seminal vesicle is nonpalpable. The right seminal vesicle is nonpalpable. The perineum is normal on inspection.  Genitourinary: Examination  of the penis demonstrates no discharge, no masses, no lesions and a normal meatus. The penis is circumcised. The scrotum is without lesions. The right epididymis is palpably normal and non-tender. The left epididymis is palpably normal and  non-tender. The right testis is non-tender and without masses. The left testis is non-tender and without masses.  Lymphatics: The femoral and inguinal nodes are not enlarged or tender.  Skin: Normal skin turgor, no visible rash and no visible skin lesions.  Neuro/Psych:. Mood and affect are appropriate.    Assessment Assessed  1. Adenocarcinoma Of The Prostate Gland 185  Plan  He is scheduled for I-125 radioactive seed implantation.

## 2012-07-20 NOTE — Progress Notes (Signed)
[facility] Radiation Oncology Brachytherapy Operative Procedure Note  Name: Quayshaun Hubbert MRN: [id number]  Date:   06/06/2012           DOB: Sep 12, 1951  Status:outpatient    QM:VHQIONG,EXBM Ramon Dredge, MD  Dr. Margaretmary Dys, Dr. Ihor Gully DIAGNOSIS: A 60 year old gentlemen with stage T1 C. adenocarcinoma of the prostate with a Gleason of 6 and a PSA of 5.33.  PROCEDURE: Insertion of radioactive I-125 seeds into the prostate gland.  RADIATION DOSE: 145 Gy, definitive therapy.  TECHNIQUE: Erinn Huskins was brought to the operating room with Dr. Vernie Ammons. He was placed in the dorsolithotomy position. He was catheterized and a rectal tube was inserted. The perineum was shaved, prepped and draped. The ultrasound probe was then introduced into the rectum to see the prostate gland.  TREATMENT DEVICE: A needle grid was attached to the ultrasound probe stand and anchor needles were placed.  COMPLEX ISODOSE CALCULATION: The prostate was imaged in 3D using a sagittal sweep of the prostate probe. These images were transferred to the planning computer. There, the prostate, urethra and rectum were defined on each axial reconstructed image. Then, the software created an optimized plan and a few seed positions were adjusted. Then the accepted plan was uploaded to the seed Selectron afterloading unit.  SPECIAL TREATMENT PROCEDURE/SUPERVISION AND HANDLING: The Nucletron FIRST system was used to place the needles under sagittal guidance. A total of 25 needles were used to deposit 76 seeds in the prostate gland. The individual seed activity was 0.58 mCi for a total implant activity of 44.46 mCi.  COMPLEX SIMULATION: At the end of the procedure, an anterior radiograph of the pelvis was obtained to document seed positioning and count. Cystoscopy was performed to check the urethra and bladder.  MICRODOSIMETRY: At the end of the procedure, the patient was emitting 0.45 mrem/hr at 1 meter.  Accordingly, he was considered safe for hospital discharge.  PLAN: The patient will return to the radiation oncology clinic for post implant CT dosimetry in three weeks.

## 2012-07-24 ENCOUNTER — Encounter (HOSPITAL_BASED_OUTPATIENT_CLINIC_OR_DEPARTMENT_OTHER): Payer: Self-pay | Admitting: Urology

## 2012-08-15 ENCOUNTER — Encounter: Payer: Self-pay | Admitting: Radiation Oncology

## 2012-08-15 ENCOUNTER — Telehealth: Payer: Self-pay | Admitting: *Deleted

## 2012-08-15 NOTE — Telephone Encounter (Signed)
CALLED PATIENT TO REMIND OF APPTS. FOR 08-16-12, LVM FOR A RETURN CALL. 

## 2012-08-16 ENCOUNTER — Encounter: Payer: Self-pay | Admitting: Radiation Oncology

## 2012-08-16 ENCOUNTER — Ambulatory Visit
Admission: RE | Admit: 2012-08-16 | Discharge: 2012-08-16 | Disposition: A | Discharge status: Home / Self Care | Source: Ambulatory Visit | Attending: Radiation Oncology | Admitting: Radiation Oncology

## 2012-08-16 ENCOUNTER — Ambulatory Visit
Admit: 2012-08-16 | Discharge: 2012-08-16 | Disposition: A | Discharge status: Home / Self Care | Attending: Radiation Oncology | Admitting: Radiation Oncology

## 2012-08-16 ENCOUNTER — Other Ambulatory Visit: Payer: Self-pay | Admitting: Radiation Oncology

## 2012-08-16 VITALS — BP 150/107 | HR 78 | Temp 98.6°F | Wt 275.6 lb

## 2012-08-16 DIAGNOSIS — C61 Malignant neoplasm of prostate: Secondary | ICD-10-CM

## 2012-08-16 NOTE — Progress Notes (Signed)
  Radiation Oncology         [phone] ________________________________  Name: Justin Winters MRN: [id number]  Date: 08/16/2012  DOB: Jan 09, 1951  COMPLEX SIMULATION NOTE  NARRATIVE:  The patient was brought to the CT Simulation planning suite today following prostate seed implantation approximately one month ago.  Identity was confirmed.  All relevant records and images related to the planned course of therapy were reviewed.  Then, the patient was set-up supine.  CT images were obtained.  The CT images were loaded into the planning software.  Then the prostate and rectum were contoured.  Treatment planning then occurred.  The implanted iodine 125 seeds were identified by the physics staff for projection of radiation distribution  I have requested : 3D Simulation  I have requested a DVH of the following structures: Prostate and rectum.    ________________________________  Artist Pais Kathrynn Running, M.D.

## 2012-08-16 NOTE — Progress Notes (Signed)
Radiation Oncology         [phone] ________________________________  Name: Quentavious Rittenhouse MRN: [id number]  Date: 08/16/2012  DOB: [birth year]  Follow-Up Visit Note  CC: Carrie Mew, MD  Stacie Glaze, MD  Diagnosis:   61 yo man with stage T1c adenocarcinoma of the prostate with a Gleason's score of 3+3 and a PSA of 5.33  Interval Since Last Radiation:  1 months  Narrative:  The patient returns today for routine follow-up.  He is complaining of increased urinary frequency and urinary hesitation symptoms. He filled out a questionnaire regarding urinary function today providing and overall IPSS score of 22 characterizing his symptoms as severe.  His pre-implant score was 13. He denies any bowel symptoms.  ALLERGIES:   has no known allergies.  Meds: Current Outpatient Prescriptions  Medication Sig Dispense Refill  . ACETAMINOPHEN-BUTALBITAL (CEPHADYN) 50-650 MG TABS Take 1 tablet by mouth 3 (three) times daily as needed.       . ciprofloxacin (CIPRO) 500 MG tablet Take 1 tablet (500 mg total) by mouth 2 (two) times daily.  6 tablet  0  . CITALOPRAM HYDROBROMIDE PO Take 40 mg by mouth daily.       . diclofenac sodium (VOLTAREN) 1 % GEL Apply 4 g topically 4 (four) times daily as needed.      Marland Kitchen HYDROcodone-acetaminophen (NORCO) 7.5-325 MG per tablet Take 1-2 tablets by mouth every 4 (four) hours as needed for pain.  20 tablet  0  . Menthol-Methyl Salicylate GEL Apply topically as needed.      . naproxen (NAPROSYN) 500 MG tablet Take 500 mg by mouth 2 (two) times daily as needed.       . Omega-3 Fatty Acids (FISH OIL) 1000 MG CAPS Take 2 capsules by mouth 2 (two) times daily.       . rosuvastatin (CRESTOR) 20 MG tablet Take 1 tablet (20 mg total) by mouth as directed. Three time a week  30 tablet  11  . tadalafil (CIALIS) 5 MG tablet Take 5 mg by mouth daily as needed.         Physical Findings: The patient is in no acute distress. Patient is alert and oriented.  weight is  275 lb 9.6 oz (125.011 kg). His temperature is 98.6 F (37 C). His blood pressure is 150/107 and his pulse is 78. .  No significant changes.  Lab Findings: Lab Results  Component Value Date   WBC 4.5 07/13/2012   HGB 15.0 07/13/2012   HCT 44.3 07/13/2012   MCV 99.8 07/13/2012   PLT 171 07/13/2012    Radiographic Findings:  Patient underwent CT imaging in our clinic for post implant dosimetry. The CT appears to demonstrate an adequate distribution of radioactive seeds throughout the prostate gland. There no seeds in her near the rectum. I suspect the final radiation plan and dosimetry will show appropriate coverage of the prostate gland.   Impression: The patient is recovering from the effects of radiation. His urinary symptoms should gradually improve over the next 4-6 months. We talked about this today. He is encouraged by his improvement already and is otherwise please with his outcome.   Plan: Today, I spent time talking to the patient about his prostate seed implant and resolving urinary symptoms. Which for long-term followup for prostate cancer following seed implant. He understands that ongoing PSA determinations and digital rectal exams will help perform surveillance to rule out disease recurrence. He understands what to expect with his PSA measures.  Patient was also educated today about some of the long-term effects would radiation including the Small risk for rectal bleeding and possibly erectile dysfunction. Talked about some of the general management approaches to these potential complications. However, I did encourage the patient to contact her office or return at any point if he has questions or concerns related to his previous radiation and prostate cancer.   _____________________________________  Artist Pais. Kathrynn Running, M.D.

## 2012-08-16 NOTE — Progress Notes (Signed)
Patient here for post seed implant appointment.IPSS score 22.Denies pain, frequency of urination.Bowels normal.

## 2012-09-08 IMAGING — CT CT HEAD W/O CM
1 series · 16 of 30 positions shown, 20 images · non-contrast
Comparison: None

CLINICAL DATA: Chronic progressive headache with worsening.
History of previous closed head injury.

CT HEAD WITHOUT CONTRAST
TECHNIQUE: Contiguous axial images were obtained from the base of
the skull through the vertex without contrast.

[Series 2: head_seq -c 4.5 h37s st · axial · 0.43mm/px · z∈[+1352,+1478]mm · 16 of 32 slices shown, 20 images]
[im 2/32  brain]
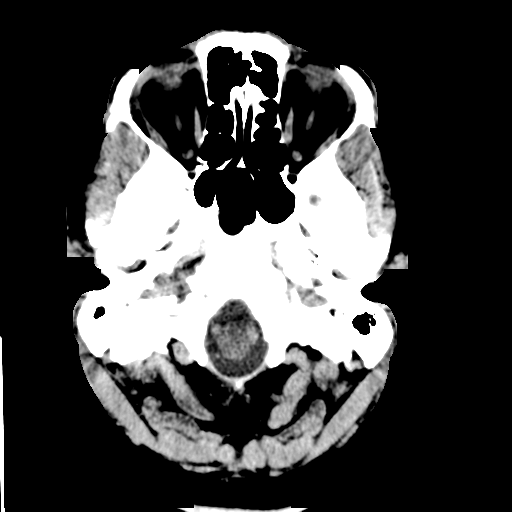
[im 2/32  bone]
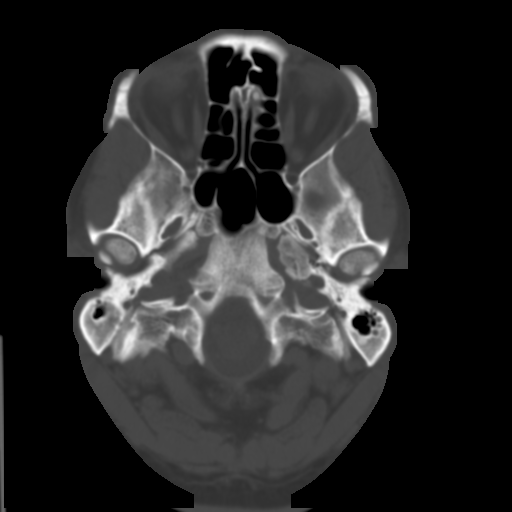
[im 4/32  brain]
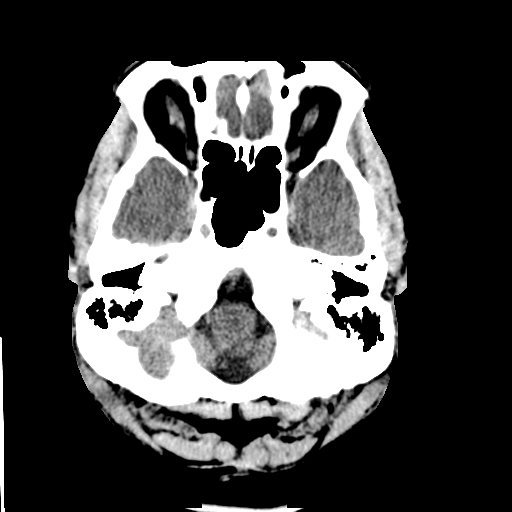
[im 6/32  brain]
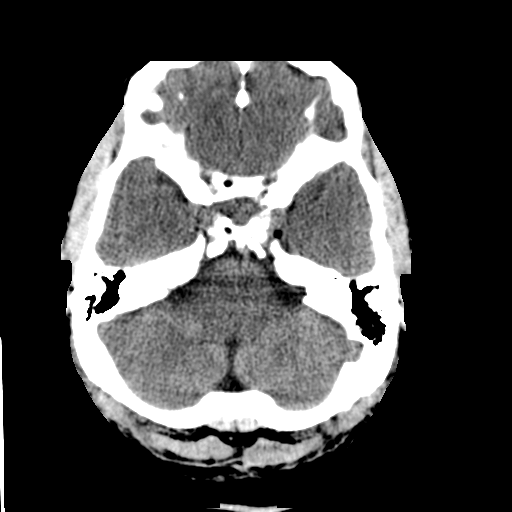
[im 8/32  brain]
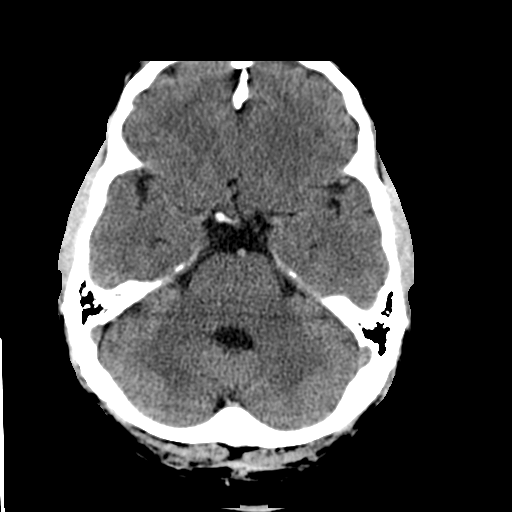
[im 9/32  brain]
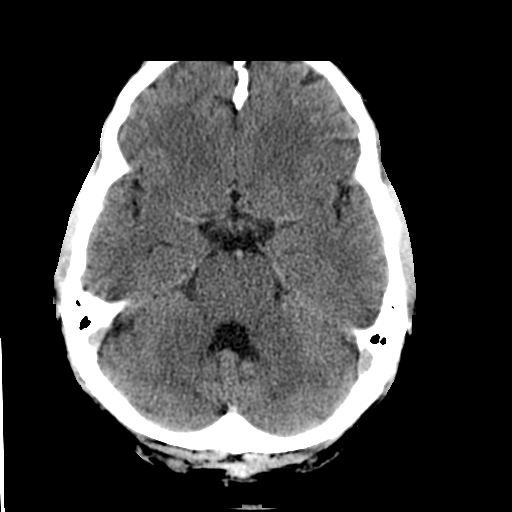
[im 9/32  bone]
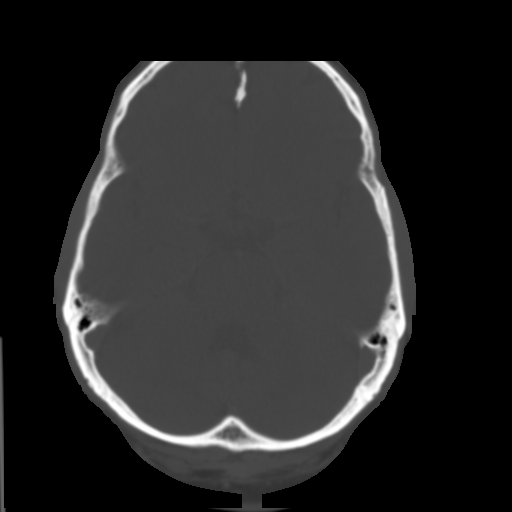
[im 11/32  brain]
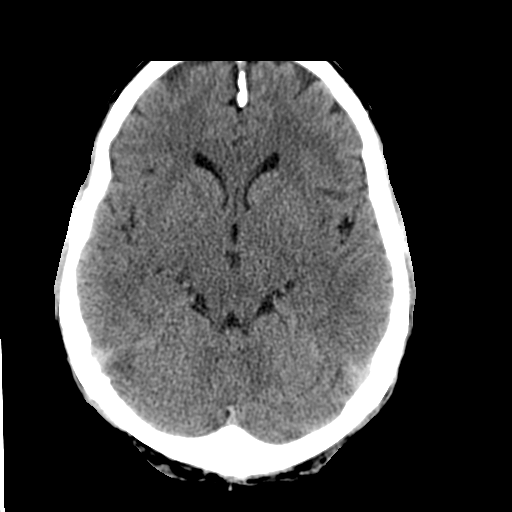
[im 13/32  brain]
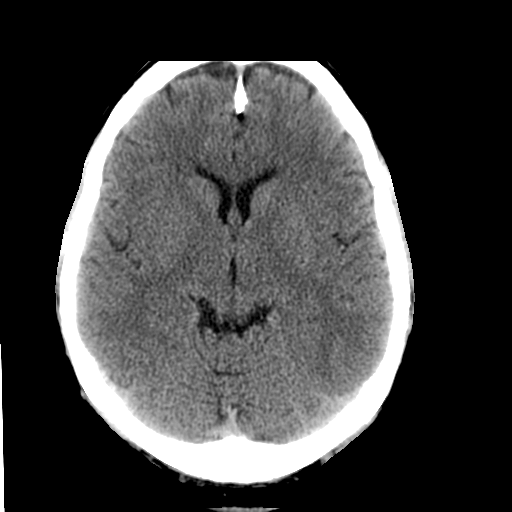
[im 15/32  brain]
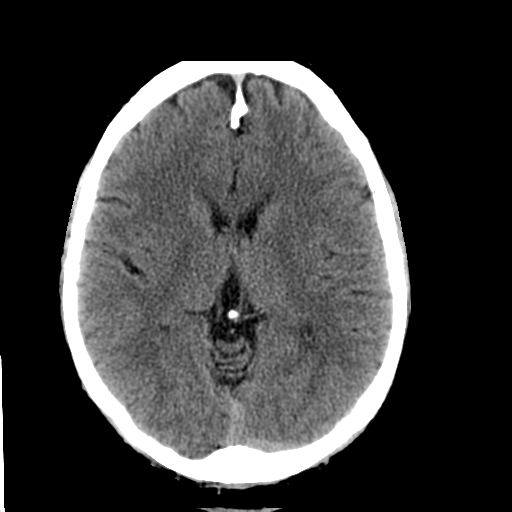
[im 17/32  brain]
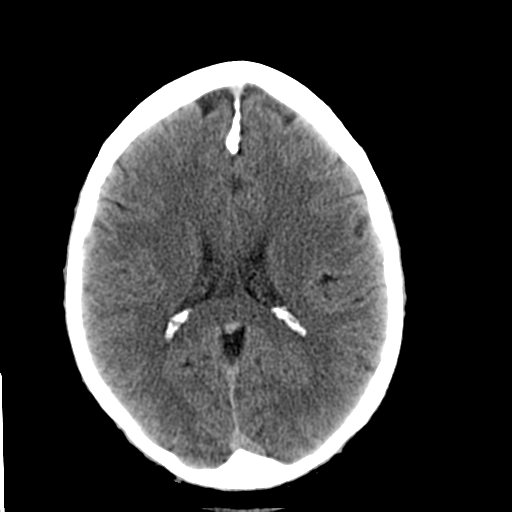
[im 17/32  bone]
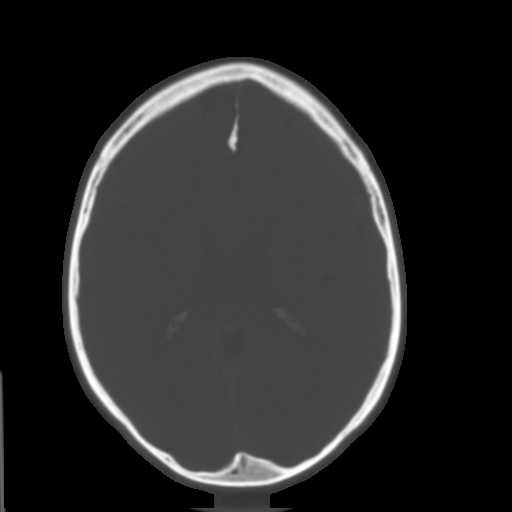
[im 19/32  brain]
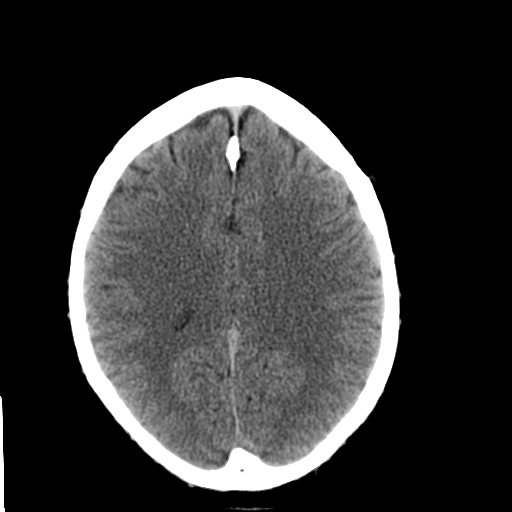
[im 21/32  brain]
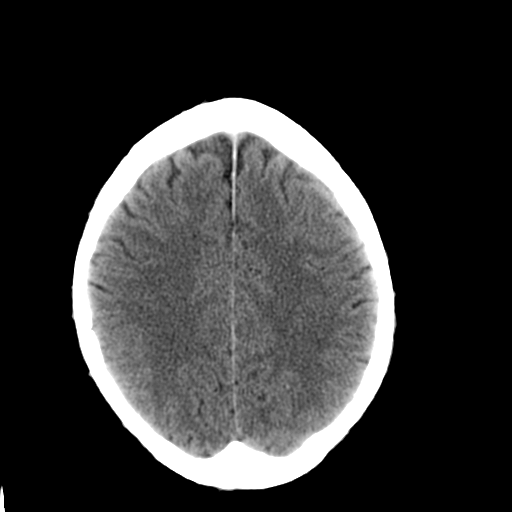
[im 23/32  brain]
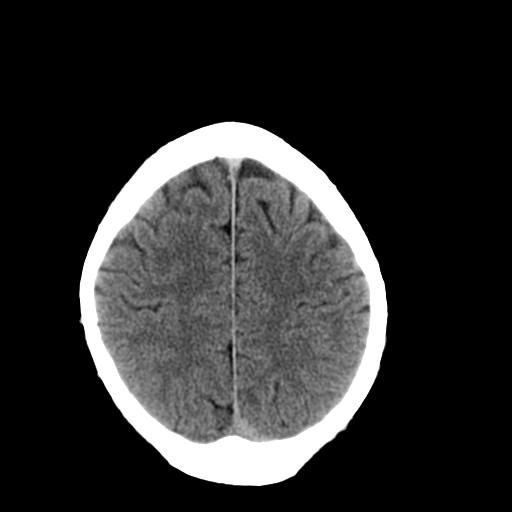
[im 24/32  brain]
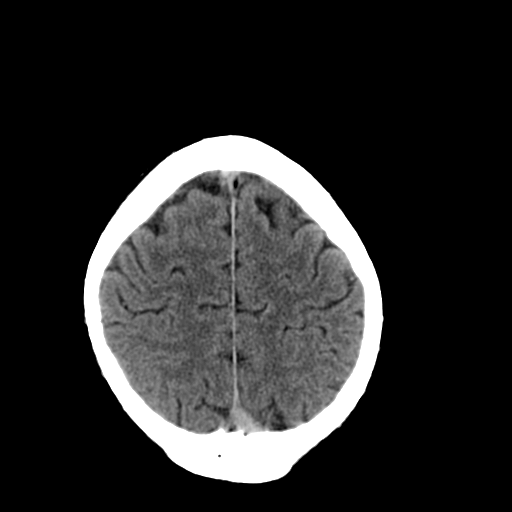
[im 24/32  bone]
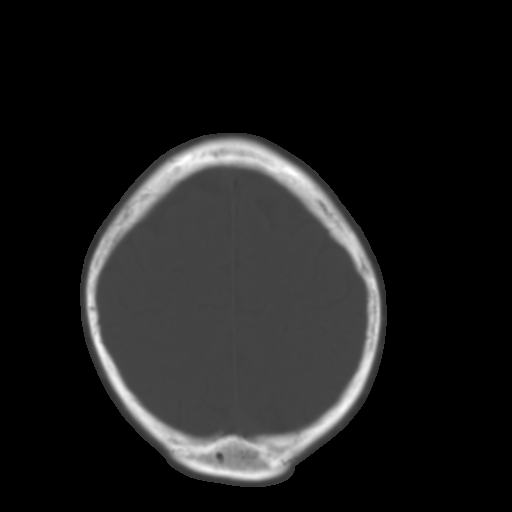
[im 26/32  brain]
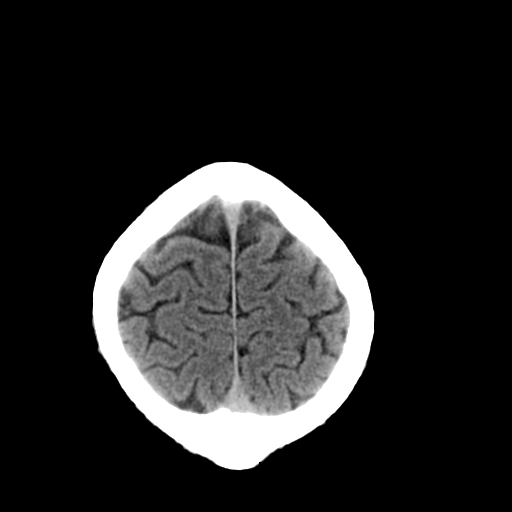
[im 28/32  brain]
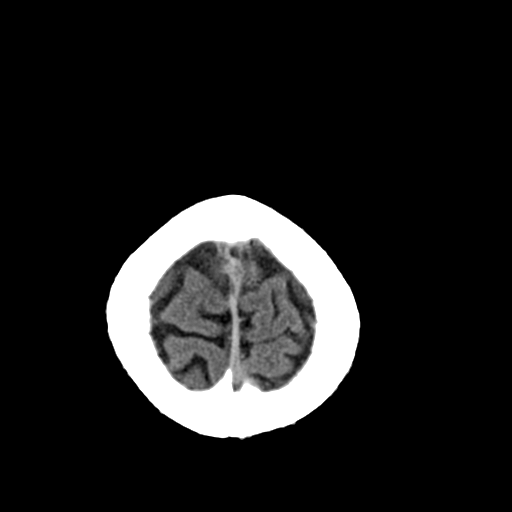
[im 30/32  brain]
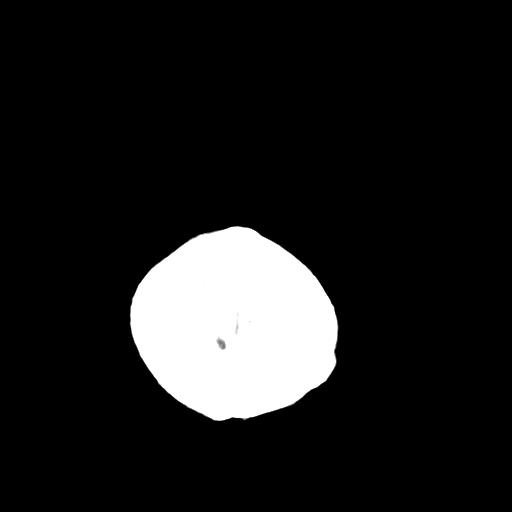

[16 of 30 positions shown; findings below may reference images not displayed]

FINDINGS: The brain has a normal appearance without evidence of
accelerated atrophy, old or acute infarction, mass lesion,
hemorrhage, hydrocephalus or extra-axial collection.  The calvarium
is unremarkable.  Sinuses, middle ears and mastoids are clear.
IMPRESSION: Normal head CT

## 2012-09-10 ENCOUNTER — Ambulatory Visit
Admission: RE | Admit: 2012-09-10 | Discharge: 2012-09-10 | Disposition: A | Discharge status: Home / Self Care | Source: Ambulatory Visit | Attending: Radiation Oncology | Admitting: Radiation Oncology

## 2012-09-10 ENCOUNTER — Encounter: Payer: Self-pay | Admitting: Radiation Oncology

## 2012-09-10 DIAGNOSIS — C61 Malignant neoplasm of prostate: Secondary | ICD-10-CM | POA: Insufficient documentation

## 2012-09-10 DIAGNOSIS — N529 Male erectile dysfunction, unspecified: Secondary | ICD-10-CM | POA: Insufficient documentation

## 2012-09-30 NOTE — Progress Notes (Signed)
  Radiation Oncology         [phone] ________________________________  Name: Justin Winters MRN: [id number]  Date: 09/10/2012  DOB: Jul 01, 1951  3-D Planning Note Prostate Brachytherapy  Diagnosis: 62 yo man with stage T1c adenocarcinoma of the prostate with a Gleason's score of 3+3 and a PSA of 5.33  Narrative: Justin Winters returned following prostate seed implantation for post implant planning. He underwent CT scan complex simulation to delineate the three-dimensional structures of the pelvis and demonstrate the radiation distribution.  Results:   Prostate Coverage - The dose of radiation delivered to the 90% or more of the prostate gland (D90) was 106.45% of the prescription dose. This exceeds our goal of greater than 90%. Rectal Sparing - The volume of rectal tissue receiving the prescription dose or higher was 0.0 cc. This falls under our thresholds tolerance of 1.0 cc.  Impression: The prostate seed implant appears to show adequate target coverage and appropriate rectal sparing.  Plan:  The patient will continue to follow with urology for ongoing PSA determinations. I would anticipate a high likelihood for local tumor control with minimal risk for rectal morbidity.  ________________________________  Artist Pais Kathrynn Running, M.D.

## 2012-11-05 ENCOUNTER — Ambulatory Visit: Admitting: Internal Medicine

## 2013-01-16 ENCOUNTER — Ambulatory Visit (INDEPENDENT_AMBULATORY_CARE_PROVIDER_SITE_OTHER): Admitting: Internal Medicine

## 2013-01-16 ENCOUNTER — Encounter: Payer: Self-pay | Admitting: Internal Medicine

## 2013-01-16 VITALS — BP 116/70 | HR 80 | Temp 98.2°F | Resp 16 | Ht 75.0 in | Wt 282.0 lb

## 2013-01-16 DIAGNOSIS — F329 Major depressive disorder, single episode, unspecified: Secondary | ICD-10-CM

## 2013-01-16 DIAGNOSIS — E291 Testicular hypofunction: Secondary | ICD-10-CM

## 2013-01-16 DIAGNOSIS — F4310 Post-traumatic stress disorder, unspecified: Secondary | ICD-10-CM

## 2013-01-16 DIAGNOSIS — E785 Hyperlipidemia, unspecified: Secondary | ICD-10-CM

## 2013-01-16 DIAGNOSIS — M1990 Unspecified osteoarthritis, unspecified site: Secondary | ICD-10-CM

## 2013-01-16 DIAGNOSIS — F419 Anxiety disorder, unspecified: Secondary | ICD-10-CM

## 2013-01-16 DIAGNOSIS — C61 Malignant neoplasm of prostate: Secondary | ICD-10-CM

## 2013-01-16 DIAGNOSIS — G4730 Sleep apnea, unspecified: Secondary | ICD-10-CM

## 2013-01-16 LAB — LIPID PANEL
HDL: 39.3 mg/dL (ref >39.00)
Triglycerides: 184 mg/dL — ABNORMAL HIGH (ref 0.0–149.0)
VLDL: 36.8 mg/dL (ref 0.0–40.0)

## 2013-01-16 LAB — HEPATIC FUNCTION PANEL
ALT: 35 U/L (ref 0–53)
Bilirubin, Direct: 0.1 mg/dL (ref 0.0–0.3)
Total Bilirubin: 1.4 mg/dL — ABNORMAL HIGH (ref 0.3–1.2)

## 2013-01-16 LAB — LDL CHOLESTEROL, DIRECT: Direct LDL: 133.6 mg/dL

## 2013-01-16 NOTE — Progress Notes (Signed)
Subjective:    Patient ID: Justin Winters, male    DOB: May 27, 1951, 62 y.o.   MRN: [id number]  HPI Follow up for weight, lipids management and hypogonadism Has completed radiation oncology treatment for prostate cancer Duplicated problem list was deleted Weight management  Review of Systems  Constitutional: Negative for fever and fatigue.  HENT: Negative for hearing loss, congestion, neck pain and postnasal drip.   Eyes: Negative for discharge, redness and visual disturbance.  Respiratory: Negative for cough, shortness of breath and wheezing.   Cardiovascular: Negative for leg swelling.  Gastrointestinal: Negative for abdominal pain, constipation and abdominal distention.  Genitourinary: Negative for urgency and frequency.  Musculoskeletal: Negative for joint swelling and arthralgias.  Skin: Negative for color change and rash.  Neurological: Negative for weakness and light-headedness.  Hematological: Negative for adenopathy.  Psychiatric/Behavioral: Negative for behavioral problems.   Past Medical History  Diagnosis Date  . Post-traumatic stress syndrome   . Hyperlipidemia   . Hypogonadism male   . Depression   . Anxiety   . Arthritis   . Prostate cancer 05/02/12    gleason 6, volume 54 cc, 2011 PSA 3.0  . Sleep apnea     has cpap- noncompliant  . Groin strain   . Hx of radiation therapy 07/20/12    prostate seed implant    History   Social History  . Marital Status: Married    Spouse Name: N/A    Number of Children: N/A  . Years of Education: N/A   Occupational History  . retired    Social History Main Topics  . Smoking status: Never Smoker   . Smokeless tobacco: Not on file  . Alcohol Use: No  . Drug Use: No  . Sexually Active: Yes   Other Topics Concern  . Not on file   Social History Narrative  . No narrative on file    Past Surgical History  Procedure Laterality Date  . No past surgeries    . Radioactive seed implant  07/20/2012    Procedure:  RADIOACTIVE SEED IMPLANT;  Surgeon: Garnett Farm, MD;  Location: [facility];  Service: Urology;  Laterality: N/A;  second timeout @ 1129 seeds implanted =76 seeds found in bladder= none    Family History  Problem Relation Age of Onset  . Dementia Mother   . Alzheimer's disease Mother   . Heart disease Father   . Hyperlipidemia    . Hypertension Sister     No Known Allergies  Current Outpatient Prescriptions on File Prior to Visit  Medication Sig Dispense Refill  . ACETAMINOPHEN-BUTALBITAL (CEPHADYN) 50-650 MG TABS Take 1 tablet by mouth 3 (three) times daily as needed.       Marland Kitchen CITALOPRAM HYDROBROMIDE PO Take 40 mg by mouth daily.       . diclofenac sodium (VOLTAREN) 1 % GEL Apply 4 g topically 4 (four) times daily as needed.      Marland Kitchen HYDROcodone-acetaminophen (NORCO) 7.5-325 MG per tablet Take 1-2 tablets by mouth every 4 (four) hours as needed for pain.  20 tablet  0  . Menthol-Methyl Salicylate GEL Apply topically as needed.      . naproxen (NAPROSYN) 500 MG tablet Take 500 mg by mouth 2 (two) times daily as needed.       . Omega-3 Fatty Acids (FISH OIL) 1000 MG CAPS Take 2 capsules by mouth 2 (two) times daily.       . rosuvastatin (CRESTOR) 20 MG tablet Take 1 tablet (  20 mg total) by mouth as directed. Three time a week  30 tablet  11  . tadalafil (CIALIS) 5 MG tablet Take 5 mg by mouth daily as needed.        No current facility-administered medications on file prior to visit.    BP 116/70  Pulse 80  Temp(Src) 98.2 F (36.8 C)  Resp 16  Ht 6\' 3"  (1.905 m)  Wt 282 lb (127.914 kg)  BMI 35.25 kg/m2       Objective:   Physical Exam  HENT:  Head: Normocephalic and atraumatic.  Eyes: Conjunctivae are normal. Pupils are equal, round, and reactive to light.  Neck: Normal range of motion. Neck supple.  Cardiovascular: Normal rate and regular rhythm.   Pulmonary/Chest: Effort normal and breath sounds normal.  Abdominal: Soft. Bowel sounds are normal.           Assessment & Plan:  Discussion of the gluten free diet Blood pressure stable  mood stable on citalopram Lipid measurement of crestor effect

## 2013-01-17 NOTE — Progress Notes (Signed)
  Subjective:    Patient ID: Justin Winters, male    DOB: 02-12-1951, 62 y.o.   MRN: [id number]  Hypertension      Review of Systems     Objective:   Physical Exam        Assessment & Plan:

## 2013-01-28 ENCOUNTER — Ambulatory Visit: Admitting: Internal Medicine

## 2013-02-15 ENCOUNTER — Encounter: Payer: Self-pay | Admitting: *Deleted

## 2013-02-18 ENCOUNTER — Ambulatory Visit: Admitting: Internal Medicine

## 2013-05-23 ENCOUNTER — Telehealth: Payer: Self-pay | Admitting: Internal Medicine

## 2013-05-23 NOTE — Telephone Encounter (Signed)
Does Justin Winters need labs before his next 50mo f/u?

## 2013-05-23 NOTE — Telephone Encounter (Signed)
Done. Appt made.

## 2013-05-23 NOTE — Telephone Encounter (Signed)
Yes- lft's,lipids and ldl cholesterol - use code 272.24

## 2013-05-27 ENCOUNTER — Ambulatory Visit: Admitting: Internal Medicine

## 2013-09-02 ENCOUNTER — Other Ambulatory Visit (INDEPENDENT_AMBULATORY_CARE_PROVIDER_SITE_OTHER)

## 2013-09-02 DIAGNOSIS — E785 Hyperlipidemia, unspecified: Secondary | ICD-10-CM

## 2013-09-02 LAB — LIPID PANEL
Cholesterol: 205 mg/dL — ABNORMAL HIGH (ref 0–200)
Triglycerides: 100 mg/dL (ref 0.0–149.0)
VLDL: 20 mg/dL (ref 0.0–40.0)

## 2013-09-02 LAB — HEPATIC FUNCTION PANEL
AST: 30 U/L (ref 0–37)
Albumin: 4 g/dL (ref 3.5–5.2)
Alkaline Phosphatase: 51 U/L (ref 39–117)
Total Protein: 7.8 g/dL (ref 6.0–8.3)

## 2013-09-02 LAB — LDL CHOLESTEROL, DIRECT: Direct LDL: 154.9 mg/dL

## 2013-09-13 ENCOUNTER — Ambulatory Visit (INDEPENDENT_AMBULATORY_CARE_PROVIDER_SITE_OTHER): Admitting: Internal Medicine

## 2013-09-13 ENCOUNTER — Encounter: Payer: Self-pay | Admitting: Internal Medicine

## 2013-09-13 VITALS — BP 124/90 | HR 76 | Temp 98.2°F | Resp 16 | Ht 74.0 in | Wt 268.0 lb

## 2013-09-13 DIAGNOSIS — N529 Male erectile dysfunction, unspecified: Secondary | ICD-10-CM

## 2013-09-13 DIAGNOSIS — E785 Hyperlipidemia, unspecified: Secondary | ICD-10-CM

## 2013-09-13 DIAGNOSIS — R030 Elevated blood-pressure reading, without diagnosis of hypertension: Secondary | ICD-10-CM

## 2013-09-13 DIAGNOSIS — Z23 Encounter for immunization: Secondary | ICD-10-CM

## 2013-09-13 MED ORDER — ROSUVASTATIN CALCIUM 20 MG PO TABS: 20.0000 mg | ORAL_TABLET | ORAL | Status: DC

## 2013-09-13 NOTE — Progress Notes (Signed)
Pre visit review using our clinic review tool, if applicable. No additional management support is needed unless otherwise documented below in the visit note. 

## 2013-09-13 NOTE — Progress Notes (Signed)
Subjective:    Patient ID: Justin Winters, male    DOB: November 29, 1950, 63 y.o.   MRN: [id number]  HPIFollowup for borderline hyperlipidemia with significant improvement in triglycerides and interval improvement in total cholesterol.  Patient's total cholesterol went from 216-205 patient's triglycerides went from 184-100..basc  The ultimate goal I believe he will need to take the Crestor twice a week     Review of Systems  Constitutional: Negative for fever and fatigue.  HENT: Negative for congestion, hearing loss and postnasal drip.   Eyes: Negative for discharge, redness and visual disturbance.  Respiratory: Negative for cough, shortness of breath and wheezing.   Cardiovascular: Negative for leg swelling.  Gastrointestinal: Negative for abdominal pain, constipation and abdominal distention.  Genitourinary: Negative for urgency and frequency.  Musculoskeletal: Negative for arthralgias, joint swelling and neck pain.  Skin: Negative for color change and rash.  Neurological: Negative for weakness and light-headedness.  Hematological: Negative for adenopathy.  Psychiatric/Behavioral: Negative for behavioral problems.       Past Medical History  Diagnosis Date  . Post-traumatic stress syndrome   . Hyperlipidemia   . Hypogonadism male   . Depression   . Anxiety   . Arthritis   . Prostate cancer 05/02/12    gleason 6, volume 54 cc, 2011 PSA 3.0  . Sleep apnea     has cpap- noncompliant  . Groin strain   . Hx of radiation therapy 07/20/12    prostate seed implant    History   Social History  . Marital Status: Married    Spouse Name: N/A    Number of Children: N/A  . Years of Education: N/A   Occupational History  . retired    Social History Main Topics  . Smoking status: Never Smoker   . Smokeless tobacco: Not on file  . Alcohol Use: No  . Drug Use: No  . Sexual Activity: Yes   Other Topics Concern  . Not on file   Social History Narrative  . No narrative on  file    Past Surgical History  Procedure Laterality Date  . No past surgeries    . Radioactive seed implant  07/20/2012    Procedure: RADIOACTIVE SEED IMPLANT;  Surgeon: Claybon Jabs, MD;  Location: [facility];  Service: Urology;  Laterality: N/A;  second timeout @ 1129 seeds implanted =76 seeds found in bladder= none    Family History  Problem Relation Age of Onset  . Dementia Mother   . Alzheimer's disease Mother   . Heart disease Father   . Hyperlipidemia    . Hypertension Sister     No Known Allergies  Current Outpatient Prescriptions on File Prior to Visit  Medication Sig Dispense Refill  . ACETAMINOPHEN-BUTALBITAL (CEPHADYN) 50-650 MG TABS Take 1 tablet by mouth 3 (three) times daily as needed.       Marland Kitchen CITALOPRAM HYDROBROMIDE PO Take 40 mg by mouth daily.       . diclofenac sodium (VOLTAREN) 1 % GEL Apply 4 g topically 4 (four) times daily as needed.      Marland Kitchen HYDROcodone-acetaminophen (NORCO) 7.5-325 MG per tablet Take 1-2 tablets by mouth every 4 (four) hours as needed for pain.  20 tablet  0  . Menthol-Methyl Salicylate GEL Apply topically as needed.      . naproxen (NAPROSYN) 500 MG tablet Take 500 mg by mouth 2 (two) times daily as needed.       . Omega-3 Fatty Acids (FISH OIL)  1000 MG CAPS Take 2 capsules by mouth 2 (two) times daily.       . tadalafil (CIALIS) 5 MG tablet Take 5 mg by mouth daily as needed.        No current facility-administered medications on file prior to visit.    BP 124/90  Pulse 76  Temp(Src) 98.2 F (36.8 C)  Resp 16  Ht 6\' 2"  (1.88 m)  Wt 268 lb (121.564 kg)  BMI 34.39 kg/m2    Objective:   Physical Exam  Constitutional: He appears well-developed and well-nourished.  HENT:  Head: Normocephalic and atraumatic.  Eyes: Conjunctivae are normal. Pupils are equal, round, and reactive to light.  Neck: Normal range of motion. Neck supple.  Cardiovascular: Normal rate and regular rhythm.   Pulmonary/Chest: Effort  normal and breath sounds normal.  Abdominal: Soft. Bowel sounds are normal.          Assessment & Plan:  Interval progress  On Zlipid add twice a week crestor Stable blood pressure Samples of cialis

## 2013-09-13 NOTE — Patient Instructions (Signed)
The patient is instructed to continue all medications as prescribed. Schedule followup with check out clerk upon leaving the clinic  

## 2013-09-30 ENCOUNTER — Telehealth: Payer: Self-pay | Admitting: Internal Medicine

## 2013-09-30 NOTE — Telephone Encounter (Signed)
[employer] denied Crestor.  Pt must try atorvastatin.

## 2013-10-01 ENCOUNTER — Other Ambulatory Visit: Payer: Self-pay | Admitting: *Deleted

## 2013-10-01 MED ORDER — ATORVASTATIN CALCIUM 20 MG PO TABS: ORAL_TABLET | ORAL | Status: DC

## 2014-04-18 ENCOUNTER — Other Ambulatory Visit

## 2014-04-23 ENCOUNTER — Encounter: Admitting: Internal Medicine

## 2014-08-20 IMAGING — CR DG CHEST 2V
2 series · 2 of 2 positions shown · non-contrast
Comparison: None.

CLINICAL DATA: Preop

CHEST - 2 VIEW

[w chest pa]
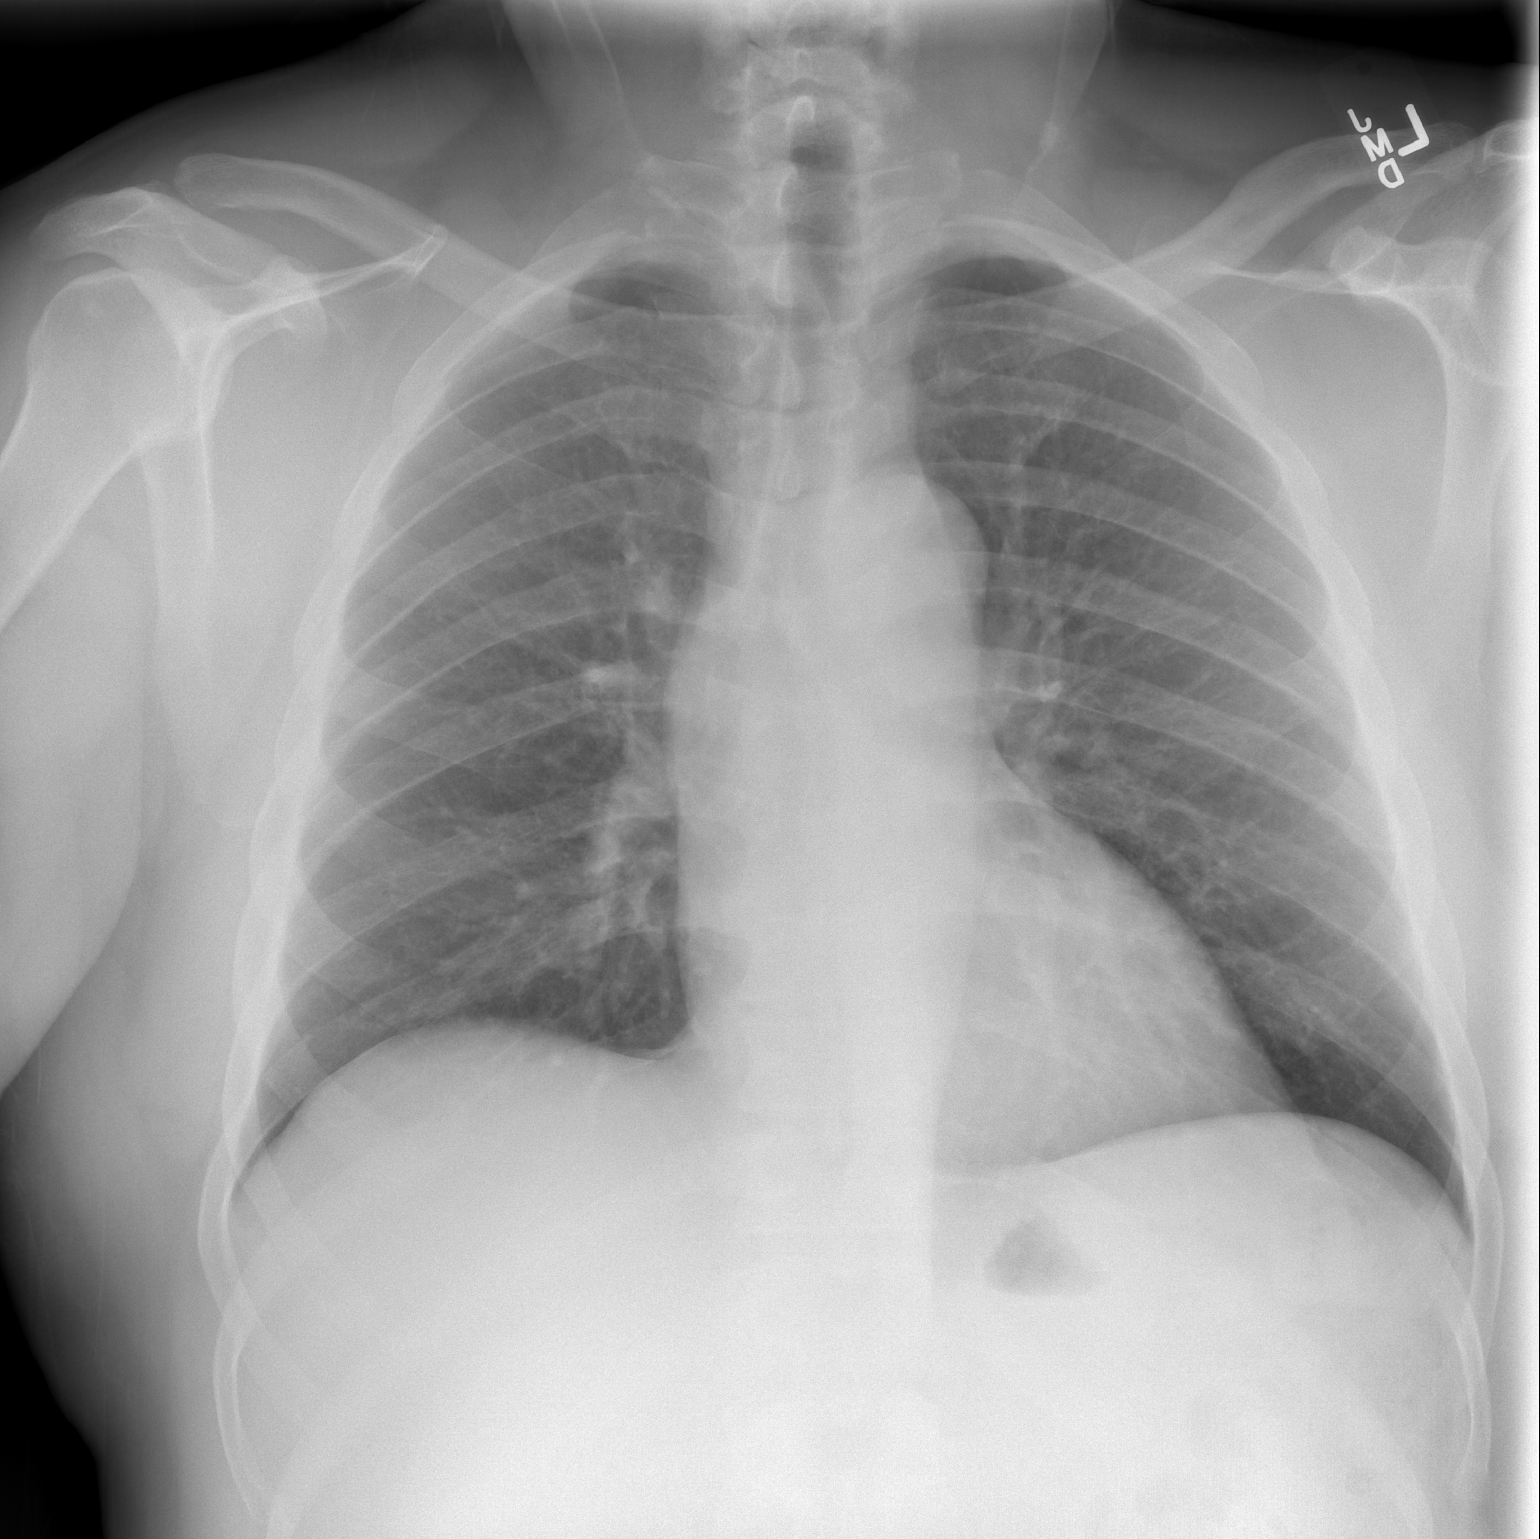

[w chest lat]
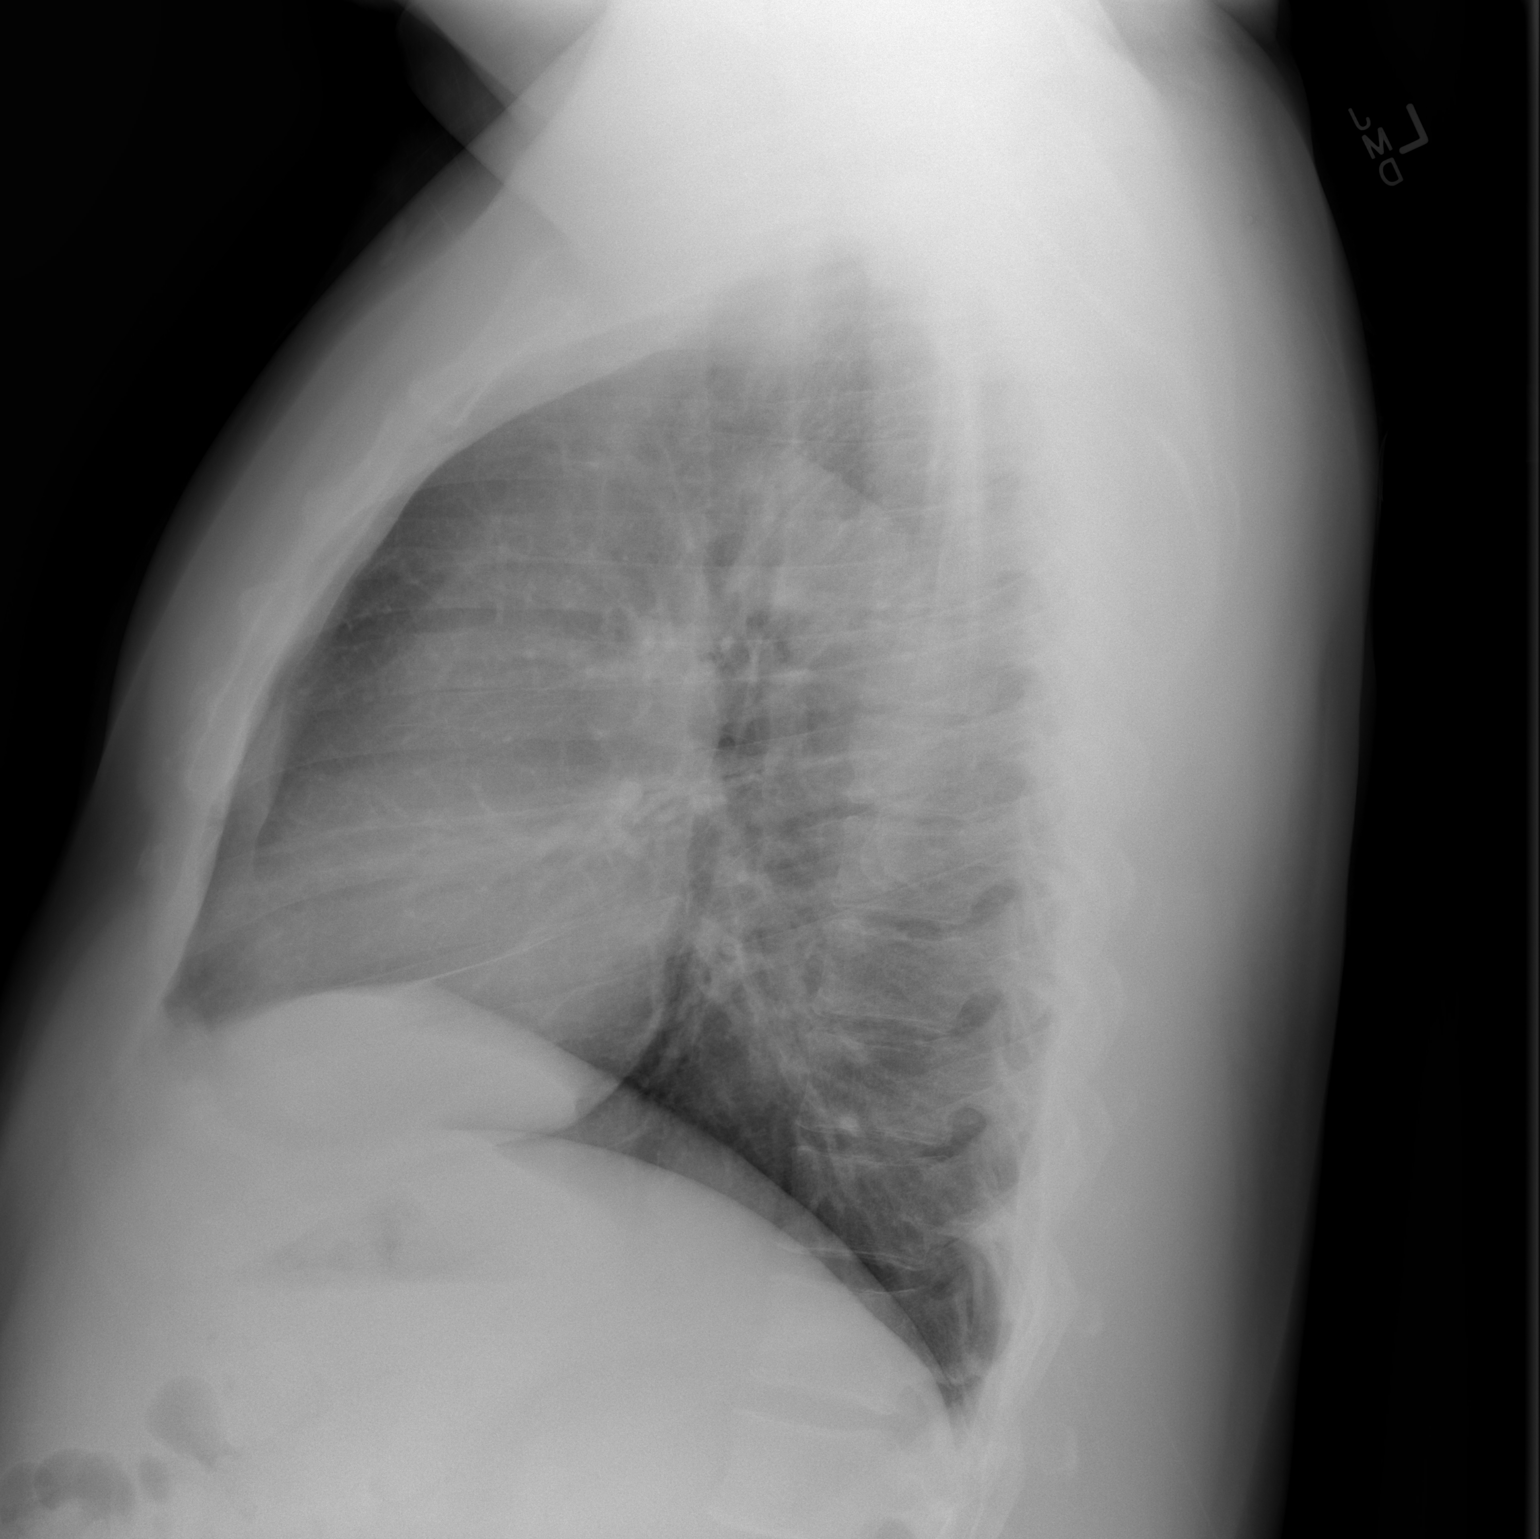

[2 of 2 positions shown; findings below may reference images not displayed]

FINDINGS: Normal heart size.  Mild tortuosity of the aortic arch.
Clear lungs.  No pneumothorax and no pleural effusion.  No acute
bony deformity.
IMPRESSION: No active cardiopulmonary disease.

## 2014-09-29 ENCOUNTER — Encounter: Payer: Self-pay | Admitting: Family Medicine

## 2014-09-29 ENCOUNTER — Ambulatory Visit (INDEPENDENT_AMBULATORY_CARE_PROVIDER_SITE_OTHER): Admitting: Family Medicine

## 2014-09-29 VITALS — BP 142/92 | Temp 98.4°F | Wt 283.0 lb

## 2014-09-29 DIAGNOSIS — N528 Other male erectile dysfunction: Secondary | ICD-10-CM

## 2014-09-29 DIAGNOSIS — R51 Headache: Secondary | ICD-10-CM

## 2014-09-29 DIAGNOSIS — N529 Male erectile dysfunction, unspecified: Secondary | ICD-10-CM

## 2014-09-29 DIAGNOSIS — I10 Essential (primary) hypertension: Secondary | ICD-10-CM

## 2014-09-29 DIAGNOSIS — R519 Headache, unspecified: Secondary | ICD-10-CM

## 2014-09-29 DIAGNOSIS — E785 Hyperlipidemia, unspecified: Secondary | ICD-10-CM

## 2014-09-29 DIAGNOSIS — Z23 Encounter for immunization: Secondary | ICD-10-CM

## 2014-09-29 DIAGNOSIS — Z1211 Encounter for screening for malignant neoplasm of colon: Secondary | ICD-10-CM

## 2014-09-29 MED ORDER — TADALAFIL 5 MG PO TABS: 5.0000 mg | ORAL_TABLET | Freq: Every day | ORAL | Status: DC | PRN
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
## 2014-09-29 NOTE — Assessment & Plan Note (Signed)
Discussed elevated LDL >150 even on twice a week crestor. Patient to work on diet/exercise and follow up in 3 months in AM and will check lipids at that time.
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
## 2014-09-29 NOTE — Patient Instructions (Addendum)
You now officially have high blood pressure with 2 elevated readings in a row on your bottom number. We may be able to treat this with diet/exercise alone. Recommend 150 minutes exercise per week. Recommend DASH eating plan which can lower blood pressure 5-6 points.   Send you for colonoscopy to prevent colon cancer. Call us if you havent heard within 2 weeks.   Cholesterol was up previously, see me in 3 months for a morning visit and come fasting and we can update your bloodwork.   Postpone shingles for 5 years, we can always give it to you if you change your mind.    DASH Eating Plan DASH stands for "Dietary Approaches to Stop Hypertension." The DASH eating plan is a healthy eating plan that has been shown to reduce high blood pressure (hypertension). Additional health benefits may include reducing the risk of type 2 diabetes mellitus, heart disease, and stroke. The DASH eating plan may also help with weight loss. WHAT DO I NEED TO KNOW ABOUT THE DASH EATING PLAN? For the DASH eating plan, you will follow these general guidelines:  Choose foods with a percent daily value for sodium of less than 5% (as listed on the food label).  Use salt-free seasonings or herbs instead of table salt or sea salt.  Check with your health care provider or pharmacist before using salt substitutes.  Eat lower-sodium products, often labeled as "lower sodium" or "no salt added."  Eat fresh foods.  Eat more vegetables, fruits, and low-fat dairy products.  Choose whole grains. Look for the word "whole" as the first word in the ingredient list.  Choose fish and skinless chicken or [place] more often than red meat. Limit fish, poultry, and meat to 6 oz (170 g) each day.  Limit sweets, desserts, sugars, and sugary drinks.  Choose heart-healthy fats.  Limit cheese to 1 oz (28 g) per day.  Eat more home-cooked food and less restaurant, buffet, and fast food.  Limit fried foods.  Cook foods using methods  other than frying.  Limit canned vegetables. If you do use them, rinse them well to decrease the sodium.  When eating at a restaurant, ask that your food be prepared with less salt, or no salt if possible. WHAT FOODS CAN I EAT? Seek help from a dietitian for individual calorie needs. Grains Whole grain or whole wheat bread. Brown rice. Whole grain or whole wheat pasta. Quinoa, bulgur, and whole grain cereals. Low-sodium cereals. Corn or whole wheat flour tortillas. Whole grain cornbread. Whole grain crackers. Low-sodium crackers. Vegetables Fresh or frozen vegetables (raw, steamed, roasted, or grilled). Low-sodium or reduced-sodium tomato and vegetable juices. Low-sodium or reduced-sodium tomato sauce and paste. Low-sodium or reduced-sodium canned vegetables.  Fruits All fresh, canned (in natural juice), or frozen fruits. Meat and Other Protein Products Ground beef (85% or leaner), grass-fed beef, or beef trimmed of fat. Skinless chicken or [place]. Ground chicken or [place]. Pork trimmed of fat. All fish and seafood. Eggs. Dried beans, peas, or lentils. Unsalted nuts and seeds. Unsalted canned beans. Dairy Low-fat dairy products, such as skim or 1% milk, 2% or reduced-fat cheeses, low-fat ricotta or cottage cheese, or plain low-fat yogurt. Low-sodium or reduced-sodium cheeses. Fats and Oils Tub margarines without trans fats. Light or reduced-fat mayonnaise and salad dressings (reduced sodium). Avocado. Safflower, olive, or canola oils. Natural peanut or almond butter. Other Unsalted popcorn and pretzels. The items listed above may not be a complete list of recommended foods or beverages. Contact your  dietitian for more options. WHAT FOODS ARE NOT RECOMMENDED? Grains White bread. White pasta. White rice. Refined cornbread. Bagels and croissants. Crackers that contain trans fat. Vegetables Creamed or fried vegetables. Vegetables in a cheese sauce. Regular canned vegetables. Regular canned  tomato sauce and paste. Regular tomato and vegetable juices. Fruits Dried fruits. Canned fruit in light or heavy syrup. Fruit juice. Meat and Other Protein Products Fatty cuts of meat. Ribs, chicken wings, bacon, sausage, bologna, salami, chitterlings, fatback, hot dogs, bratwurst, and packaged luncheon meats. Salted nuts and seeds. Canned beans with salt. Dairy Whole or 2% milk, cream, half-and-half, and cream cheese. Whole-fat or sweetened yogurt. Full-fat cheeses or blue cheese. Nondairy creamers and whipped toppings. Processed cheese, cheese spreads, or cheese curds. Condiments Onion and garlic salt, seasoned salt, table salt, and sea salt. Canned and packaged gravies. Worcestershire sauce. Tartar sauce. Barbecue sauce. Teriyaki sauce. Soy sauce, including reduced sodium. Steak sauce. Fish sauce. Oyster sauce. Cocktail sauce. Horseradish. Ketchup and mustard. Meat flavorings and tenderizers. Bouillon cubes. Hot sauce. Tabasco sauce. Marinades. Taco seasonings. Relishes. Fats and Oils Butter, stick margarine, lard, shortening, ghee, and bacon fat. Coconut, palm kernel, or palm oils. Regular salad dressings. Other Pickles and olives. Salted popcorn and pretzels. The items listed above may not be a complete list of foods and beverages to avoid. Contact your dietitian for more information. WHERE CAN I FIND MORE INFORMATION? National Heart, Lung, and Blood Institute: travelstabloid.com Document Released: 08/18/2011 Document Revised: 01/13/2014 Document Reviewed: 07/03/2013 [facility] Patient Information 2015 [place], [place]. This information is not intended to replace advice given to you by your health care provider. Make sure you discuss any questions you have with your health care provider.

## 2014-09-29 NOTE — Assessment & Plan Note (Signed)
New diagnosis with 2 consecutive elevations of DBP. We discussed interplay of weight, HTN, HLD and cardiac risk. Recommended DASH diet, weight loss, increasing regular exercise. Patient to follow in 3 months.

## 2014-09-29 NOTE — Assessment & Plan Note (Signed)
Controlled with cialis 5mg  prn. Refill provided.

## 2014-12-10 ENCOUNTER — Encounter: Payer: Self-pay | Admitting: Family Medicine

## 2014-12-10 LAB — PSA: PSA, FREE: 0.13

## 2014-12-29 ENCOUNTER — Ambulatory Visit (INDEPENDENT_AMBULATORY_CARE_PROVIDER_SITE_OTHER): Admitting: Family Medicine

## 2014-12-29 ENCOUNTER — Encounter: Payer: Self-pay | Admitting: Family Medicine

## 2014-12-29 VITALS — BP 116/84 | HR 71 | Temp 98.1°F | Wt 267.0 lb

## 2014-12-29 DIAGNOSIS — Z1211 Encounter for screening for malignant neoplasm of colon: Secondary | ICD-10-CM

## 2014-12-29 DIAGNOSIS — E785 Hyperlipidemia, unspecified: Secondary | ICD-10-CM | POA: Diagnosis not present

## 2014-12-29 DIAGNOSIS — I10 Essential (primary) hypertension: Secondary | ICD-10-CM

## 2014-12-29 DIAGNOSIS — Z20828 Contact with and (suspected) exposure to other viral communicable diseases: Secondary | ICD-10-CM | POA: Diagnosis not present

## 2014-12-29 LAB — CBC
HCT: 45.1 % (ref 39.0–52.0)
Hemoglobin: 15.2 g/dL (ref 13.0–17.0)
MCHC: 33.7 g/dL (ref 30.0–36.0)
MCV: 99.1 fl (ref 78.0–100.0)
PLATELETS: 186 10*3/uL (ref 150.0–400.0)
RBC: 4.55 Mil/uL (ref 4.22–5.81)
RDW: 13.1 % (ref 11.5–15.5)
WBC: 4.9 10*3/uL (ref 4.0–10.5)

## 2014-12-29 LAB — LIPID PANEL
CHOL/HDL RATIO: 5
Cholesterol: 217 mg/dL — ABNORMAL HIGH (ref 0–200)
HDL: 45.6 mg/dL (ref >39.00)
LDL CALC: 154 mg/dL — AB (ref 0–99)
NONHDL: 171.4
Triglycerides: 87 mg/dL (ref 0.0–149.0)
VLDL: 17.4 mg/dL (ref 0.0–40.0)

## 2014-12-29 LAB — TSH: TSH: 1.42 u[IU]/mL (ref 0.35–4.50)

## 2014-12-29 LAB — COMPREHENSIVE METABOLIC PANEL
ALBUMIN: 4.2 g/dL (ref 3.5–5.2)
ALT: 41 U/L (ref 0–53)
AST: 38 U/L — ABNORMAL HIGH (ref 0–37)
Alkaline Phosphatase: 58 U/L (ref 39–117)
BUN: 22 mg/dL (ref 6–23)
CALCIUM: 10.2 mg/dL (ref 8.4–10.5)
CHLORIDE: 104 meq/L (ref 96–112)
CO2: 28 meq/L (ref 19–32)
Creatinine, Ser: 1.3 mg/dL (ref 0.40–1.50)
GFR: 71.94 mL/min (ref >60.00)
GLUCOSE: 83 mg/dL (ref 70–99)
POTASSIUM: 4.8 meq/L (ref 3.5–5.1)
SODIUM: 139 meq/L (ref 135–145)
TOTAL PROTEIN: 8.3 g/dL (ref 6.0–8.3)
Total Bilirubin: 1.6 mg/dL — ABNORMAL HIGH (ref 0.2–1.2)

## 2014-12-29 NOTE — Progress Notes (Signed)
Garret Reddish, MD Phone: [phone]  Subjective:   Justin Winters is a 64 y.o. year old very pleasant male patient who presents with the following:  Patient has been working on [employer] and regular exercise. Weight down 16  Hypertension-controlled on repeat testing  BP Readings from Last 3 Encounters:  12/29/14 116/84  09/29/14 142/92  09/13/13 124/90   Home BP monitoring-no Compliant with medications-DASH diet alone ROS-Denies any CP, HA, SOB, blurry vision, LE edema  Hyperlipidemia-suspect improved control on crestor twice a week for 2 months (cut back from 3x a week but lost 16 lbs) ROS- no chest pain or shortness of breath. No myalgias  Past Medical History- Patient Active Problem List   Diagnosis Date Noted  . Prostate cancer 05/02/2012    Priority: Medium  . Headache 06/24/2010    Priority: Medium  . Essential hypertension 06/24/2010    Priority: Medium  . Hyperlipidemia 11/17/2009    Priority: Medium  . PTSD 06/24/2009    Priority: Medium  . Sleep apnea 06/24/2009    Priority: Medium  . 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 Osteoarthritis of knee 06/24/2009    Priority: Low   Medications- reviewed and updated Current Outpatient Prescriptions  Medication Sig Dispense Refill  . CITALOPRAM HYDROBROMIDE PO Take 40 mg by mouth daily.     . diclofenac sodium (VOLTAREN) 1 % GEL Apply 4 g topically 4 (four) times daily as needed.    . Menthol-Methyl Salicylate GEL Apply topically as needed.    . Omega-3 Fatty Acids (FISH OIL) 1000 MG CAPS Take 2 capsules by mouth 2 (two) times daily.     . rosuvastatin (CRESTOR) 20 MG tablet Take 1 tablet (20 mg total) by mouth 2 (two) times a week. Three time a week 30 tablet 11  . ACETAMINOPHEN-BUTALBITAL (CEPHADYN) 50-650 MG TABS Take 1 tablet by mouth 3 (three) times daily as needed.     . naproxen (NAPROSYN) 500 MG tablet Take 500 mg by mouth 2 (two) times daily as needed.     . tadalafil (CIALIS) 5  MG tablet Take 1 tablet (5 mg total) by mouth daily as needed. (Patient not taking: Reported on 12/29/2014) 10 tablet 11   Objective: BP 116/84 mmHg  Pulse 71  Temp(Src) 98.1 F (36.7 C)  Wt 267 lb (121.11 kg) Gen: NAD, resting comfortably CV: RRR no murmurs rubs or gallops Lungs: CTAB no crackles, wheeze, rhonchi Abdomen: soft/nontender/nondistended/normal bowel sounds. Obese but improved.  Ext: no edema Skin: warm, dry, no rash Neuro: grossly normal, moves all extremities  Assessment/Plan:  Hyperlipidemia LDL goal <100. Weight down 16 lbs. Last LDL 155 on Crestor 20mg  2x week-had increased to 3x a week but patient cut back down to 2x a week. If patient is making progress on LDL to at least <130 will hold off on increasing given weight loss and hope for continued path with goal 240 by 6 month visit.    Essential hypertension Well controlled now with 16 lb weight loss and exercise. Using DASH diet. Normal cuff (diastolic 90) may be a hair small so checked with large cuff and reading controlled 116/84.     6 month follow up. Moved into class I obesity. Advised weight goal 240 by follow up (possibly 220 for longer term goal)  Orders Placed This Encounter  Procedures  . CBC    [place]  . Comprehensive metabolic panel        Order Specific Question:  Has the  patient fasted?    Answer:  No  . Lipid panel    [place]    Order Specific Question:  Has the patient fasted?    Answer:  No  . TSH    [place]  . HIV antibody (with reflex)  . Ambulatory referral to Gastroenterology    Referral Priority:  Routine    Referral Type:  Consultation    Referral Reason:  Specialty Services Required    Requested Specialty:  Gastroenterology    Number of Visits Requested:  1

## 2014-12-29 NOTE — Assessment & Plan Note (Signed)
LDL goal <100. Weight down 16 lbs. Last LDL 155 on Crestor 20mg  2x week-had increased to 3x a week but patient cut back down to 2x a week. If patient is making progress on LDL to at least <130 will hold off on increasing given weight loss and hope for continued path with goal 240 by 6 month visit.

## 2014-12-29 NOTE — Patient Instructions (Addendum)
You will receive a called from [facility] to set up your colonoscopy.  Thrilled for you with 16 pound weight loss. Goal over next year to get down to 240 with a goal at 6 month visit of 250.   Good luck on getting that handicap down  Update fasting labs today. BP just a hair high on initial check but recheck with appropriate cuff looked great. Hopeful to keep you less than 135/90 in the long run without medicine.   1x screen for HIV per national guidelines any person of any risk between age 38-65

## 2014-12-29 NOTE — Assessment & Plan Note (Signed)
Well controlled now with 16 lb weight loss and exercise. Using DASH diet. Normal cuff (diastolic 90) may be a hair small so checked with large cuff and reading controlled 116/84.

## 2014-12-30 LAB — HIV ANTIBODY (ROUTINE TESTING W REFLEX): HIV 1&2 Ab, 4th Generation: NONREACTIVE

## 2014-12-31 ENCOUNTER — Telehealth: Payer: Self-pay | Admitting: Family Medicine

## 2014-12-31 MED ORDER — SILDENAFIL CITRATE 50 MG PO TABS: 50.0000 mg | ORAL_TABLET | Freq: Every day | ORAL | Status: DC | PRN

## 2014-12-31 NOTE — Telephone Encounter (Signed)
See below

## 2014-12-31 NOTE — Telephone Encounter (Signed)
viagra sent in.

## 2014-12-31 NOTE — Telephone Encounter (Signed)
PA for Cialis was denied.  Patient's plan prefers Viagra, quantity  6 per 30 days.

## 2014-12-31 NOTE — Telephone Encounter (Signed)
May trial 50mg  at quantity prescribed

## 2015-06-30 ENCOUNTER — Ambulatory Visit: Admitting: Family Medicine

## 2015-07-14 ENCOUNTER — Ambulatory Visit (INDEPENDENT_AMBULATORY_CARE_PROVIDER_SITE_OTHER): Admitting: Family Medicine

## 2015-07-14 ENCOUNTER — Encounter: Payer: Self-pay | Admitting: Family Medicine

## 2015-07-14 ENCOUNTER — Encounter: Payer: Self-pay | Admitting: Gastroenterology

## 2015-07-14 VITALS — BP 122/90 | HR 67 | Temp 98.6°F | Wt 268.0 lb

## 2015-07-14 DIAGNOSIS — E785 Hyperlipidemia, unspecified: Secondary | ICD-10-CM

## 2015-07-14 DIAGNOSIS — Z23 Encounter for immunization: Secondary | ICD-10-CM | POA: Diagnosis not present

## 2015-07-14 DIAGNOSIS — Z1211 Encounter for screening for malignant neoplasm of colon: Secondary | ICD-10-CM

## 2015-07-14 DIAGNOSIS — I10 Essential (primary) hypertension: Secondary | ICD-10-CM

## 2015-07-14 NOTE — Progress Notes (Signed)
Garret Reddish, MD  Subjective:  Justin Winters is a 64 y.o. year old very pleasant male patient who presents for/with See problem oriented charting ROS- No chest pain or shortness of breath. No headache or blurry vision.   Past Medical History-  Patient Active Problem List   Diagnosis Date Noted  . Prostate cancer ([place]) 05/02/2012    Priority: Medium  . Headache 06/24/2010    Priority: Medium  . Essential hypertension 06/24/2010    Priority: Medium  . Hyperlipidemia 11/17/2009    Priority: Medium  . PTSD 06/24/2009    Priority: Medium  . Sleep apnea 06/24/2009    Priority: Medium  . 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 Osteoarthritis of knee 06/24/2009    Priority: Low    Medications- reviewed and updated Current Outpatient Prescriptions  Medication Sig Dispense Refill  . CITALOPRAM HYDROBROMIDE PO Take 40 mg by mouth daily.     . Omega-3 Fatty Acids (FISH OIL) 1000 MG CAPS Take 2 capsules by mouth 2 (two) times daily.     . rosuvastatin (CRESTOR) 20 MG tablet Take 1 tablet (20 mg total) by mouth 2 (two) times a week. Three time a week 30 tablet 11  . ACETAMINOPHEN-BUTALBITAL (CEPHADYN) 50-650 MG TABS Take 1 tablet by mouth 3 (three) times daily as needed.     . diclofenac sodium (VOLTAREN) 1 % GEL Apply 4 g topically 4 (four) times daily as needed.    . Menthol-Methyl Salicylate GEL Apply topically as needed.    . naproxen (NAPROSYN) 500 MG tablet Take 500 mg by mouth 2 (two) times daily as needed.     . sildenafil (VIAGRA) 50 MG tablet Take 1 tablet (50 mg total) by mouth daily as needed for erectile dysfunction. (Patient not taking: Reported on 07/14/2015) 6 tablet 2  . tadalafil (CIALIS) 5 MG tablet Take 1 tablet (5 mg total) by mouth daily as needed. (Patient not taking: Reported on 12/29/2014) 10 tablet 11   No current facility-administered medications for this visit.    Objective: BP 122/90 mmHg  Pulse 67  Temp(Src) 98.6 F (37 C)  Wt 268  lb (121.564 kg) Gen: NAD, resting comfortably CV: RRR no murmurs rubs or gallops Lungs: CTAB no crackles, wheeze, rhonchi Abdomen: soft/nontender/nondistended/normal bowel sounds. No rebound or guarding. Overweight/obese Ext: no edema Skin: warm, dry Neuro: grossly normal, moves all extremities  Assessment/Plan:  Essential hypertension S: controlled previously. Diastolic mild poor control. Patient Wt up 1 lb. Goal 240 but wt 268.  good cooking has hampered efforts, L knee bothering him- advil helping- going to start walking again.  BP Readings from Last 3 Encounters:  07/14/15 122/90  12/29/14 116/84  09/29/14 142/92  A/P:Continue weight loss efforts. BP was controlled when regularly walking. Encouraged need for healthy eating, regular exercise, weight loss.     Hyperlipidemia S: poorly controlled with Last LDL 155 on Crestor 20mg  2x week. We held off on medication increase as had lost 16 lbs last visit and plan was continued efforts but these have softened A/P: set goal 10 lbs down at 3 months or 12 lbs in 4 months. Goal is to get LDL at least less than 130 without medication. Consider direct LDL at follow up vs. Waiting on complete set 12/29/15 or later  3 month weight and BP check  Orders Placed This Encounter  Procedures  . Flu Vaccine QUAD 36+ mos IM  . Ambulatory referral to Gastroenterology    Referral Priority:  Routine    Referral Type:  Consultation    Referral Reason:  Specialty Services Required    Number of Visits Requested:  1   Health Maintenance Due  Topic Date Due  . Hepatitis C Screening  06/20/1951  . COLONOSCOPY  01/07/2003  referred last visit- will do again Hep C- with next bloodwork

## 2015-07-14 NOTE — Assessment & Plan Note (Signed)
S: poorly controlled with Last LDL 155 on Crestor 20mg  2x week. We held off on medication increase as had lost 16 lbs last visit and plan was continued efforts but these have softened A/P: set goal 10 lbs down at 3 months or 12 lbs in 4 months. Goal is to get LDL at least less than 130 without medication. Consider direct LDL at follow up vs. Waiting on complete set 12/29/15 or later

## 2015-07-14 NOTE — Assessment & Plan Note (Signed)
S: controlled previously. Diastolic mild poor control. Patient Wt up 1 lb. Goal 240 but wt 268.  good cooking has hampered efforts, L knee bothering him- advil helping- going to start walking again.  BP Readings from Last 3 Encounters:  07/14/15 122/90  12/29/14 116/84  09/29/14 142/92  A/P:Continue weight loss efforts. BP was controlled when regularly walking. Encouraged need for healthy eating, regular exercise, weight loss.

## 2015-07-14 NOTE — Patient Instructions (Addendum)
Flu shot received today.  Blood pressure and cholesterol a little high but we are going to continue to strive for weight loss. Goal 10 lbs over next 3 months. Goal about a pound a week. Get back on the walking and healthy eating.   Goal blood pressure <145/90 BP Readings from Last 3 Encounters:  07/14/15 122/90  12/29/14 116/84  09/29/14 142/92   Goal LDL <100, at least less than 130 Lab Results  Component Value Date   CHOL 217* 12/29/2014   HDL 45.60 12/29/2014   LDLCALC 154* 12/29/2014   TRIG 87.0 12/29/2014   CHOLHDL 5 12/29/2014   See you in 3-4 months

## 2015-09-08 ENCOUNTER — Ambulatory Visit (AMBULATORY_SURGERY_CENTER): Payer: Self-pay | Admitting: *Deleted

## 2015-09-08 VITALS — Ht 74.0 in | Wt 280.0 lb

## 2015-09-08 DIAGNOSIS — Z1211 Encounter for screening for malignant neoplasm of colon: Secondary | ICD-10-CM

## 2015-09-08 MED ORDER — NA SULFATE-K SULFATE-MG SULF 17.5-3.13-1.6 GM/177ML PO SOLN: 1.0000 | Freq: Once | ORAL | Status: DC

## 2015-09-08 NOTE — Progress Notes (Signed)
No egg or soy allergy known to patient  No issues with past sedation with any surgeries  or procedures, no intubation problems  No diet pills per patient  No home 02 use per patient   emmi declined

## 2015-09-22 ENCOUNTER — Ambulatory Visit (AMBULATORY_SURGERY_CENTER): Admitting: Gastroenterology

## 2015-09-22 ENCOUNTER — Encounter: Payer: Self-pay | Admitting: Gastroenterology

## 2015-09-22 VITALS — BP 125/77 | HR 79 | Temp 96.8°F | Resp 32 | Ht 74.0 in | Wt 280.0 lb

## 2015-09-22 DIAGNOSIS — Z1211 Encounter for screening for malignant neoplasm of colon: Secondary | ICD-10-CM

## 2015-09-22 DIAGNOSIS — D123 Benign neoplasm of transverse colon: Secondary | ICD-10-CM

## 2015-09-22 DIAGNOSIS — D122 Benign neoplasm of ascending colon: Secondary | ICD-10-CM | POA: Diagnosis not present

## 2015-09-22 MED ORDER — SODIUM CHLORIDE 0.9 % IV SOLN: 500.0000 mL | INTRAVENOUS | Status: DC

## 2015-09-22 NOTE — Progress Notes (Signed)
Called to room to assist during endoscopic procedure.  Patient ID and intended procedure confirmed with present staff. Received instructions for my participation in the procedure from the performing physician.  

## 2015-09-22 NOTE — Progress Notes (Signed)
Stable to RR 

## 2015-09-22 NOTE — Op Note (Signed)
[facility]  [address] [place], [id number]   COLONOSCOPY PROCEDURE REPORT  PATIENT: Justin Winters, Justin Winters  MR#: [id number] BIRTHDATE: Sep 10, 1951 , 64  yrs. old GENDER: male ENDOSCOPIST: Milus Banister, MD REFERRED [id number] Kristian Covey, M.D. PROCEDURE DATE:  09/22/2015 PROCEDURE:   Colonoscopy, screening and Colonoscopy with snare polypectomy First Screening Colonoscopy - Avg.  risk and is 50 yrs.  old or older Yes.  Prior Negative Screening - Now for repeat screening. N/A  History of Adenoma - Now for follow-up colonoscopy & has been > or = to 3 yrs.  N/A  Polyps removed today? Yes ASA CLASS:   Class II INDICATIONS:Screening for colonic neoplasia and Colorectal Neoplasm Risk Assessment for this procedure is average risk. MEDICATIONS: Monitored anesthesia care, Propofol 250 mg IV, and lidocaine 40mg  IV  DESCRIPTION OF PROCEDURE:   After the risks benefits and alternatives of the procedure were thoroughly explained, informed consent was obtained.  The digital rectal exam revealed no abnormalities of the rectum.   The LB [id number]  endoscope was introduced through the anus and advanced to the cecum, which was identified by both the appendix and ileocecal valve. No adverse events experienced.   The quality of the prep was excellent.  The instrument was then slowly withdrawn as the colon was fully examined. Estimated blood loss is zero unless otherwise noted in this procedure report.  COLON FINDINGS: Three sessile polyps ranging between 3-52mm in size were found in the transverse colon and ascending colon. Polypectomies were performed with a cold snare.  The resection was complete, the polyp tissue was completely retrieved and sent to histology.   The examination was otherwise normal.  Retroflexed views revealed no abnormalities. The time to cecum = 3.1 Withdrawal time = 9.6   The scope was withdrawn and the procedure completed. COMPLICATIONS: There were no  immediate complications.  ENDOSCOPIC IMPRESSION: 1. Three sessile polyps ranging between 3-10mm in size were found in the transverse colon and ascending colon; polypectomies were performed with a cold snare 2.   The examination was otherwise normal  RECOMMENDATIONS: If the polyp(s) removed today are proven to be adenomatous (pre-cancerous) polyps, you will need a colonoscopy in 3-5 years. Otherwise you should continue to follow colorectal cancer screening guidelines for "routine risk" patients with a colonoscopy in 10 years.  You will receive a letter within 1-2 weeks with the results of your biopsy as well as final recommendations.  Please call my office if you have not received a letter after 3 weeks.  eSigned:  Milus Banister, MD 09/22/2015 10:16 AM

## 2015-09-22 NOTE — Patient Instructions (Signed)
YOU HAD AN ENDOSCOPIC PROCEDURE TODAY AT [facility]:   Refer to the procedure report that was given to you for any specific questions about what was found during the examination.  If the procedure report does not answer your questions, please call your gastroenterologist to clarify.  If you requested that your care partner not be given the details of your procedure findings, then the procedure report has been included in a sealed envelope for you to review at your convenience later.  YOU SHOULD EXPECT: Some feelings of bloating in the abdomen. Passage of more gas than usual.  Walking can help get rid of the air that was put into your GI tract during the procedure and reduce the bloating. If you had a lower endoscopy (such as a colonoscopy or flexible sigmoidoscopy) you may notice spotting of blood in your stool or on the toilet paper. If you underwent a bowel prep for your procedure, you may not have a normal bowel movement for a few days.  Please Note:  You might notice some irritation and congestion in your nose or some drainage.  This is from the oxygen used during your procedure.  There is no need for concern and it should clear up in a day or so.  SYMPTOMS TO REPORT IMMEDIATELY:   Following lower endoscopy (colonoscopy or flexible sigmoidoscopy):  Excessive amounts of blood in the stool  Significant tenderness or worsening of abdominal pains  Swelling of the abdomen that is new, acute  Fever of 100F or higher   For urgent or emergent issues, a gastroenterologist can be reached at any hour by calling [phone].   DIET: Your first meal following the procedure should be a small meal and then it is ok to progress to your normal diet. Heavy or fried foods are harder to digest and may make you feel nauseous or bloated.  Likewise, meals heavy in dairy and vegetables can increase bloating.  Drink plenty of fluids but you should avoid alcoholic beverages for 24  hours.  ACTIVITY:  You should plan to take it easy for the rest of today and you should NOT DRIVE or use heavy machinery until tomorrow (because of the sedation medicines used during the test).    FOLLOW UP: Our staff will call the number listed on your records the next business day following your procedure to check on you and address any questions or concerns that you may have regarding the information given to you following your procedure. If we do not reach you, we will leave a message.  However, if you are feeling well and you are not experiencing any problems, there is no need to return our call.  We will assume that you have returned to your regular daily activities without incident.  If any biopsies were taken you will be contacted by phone or by letter within the next 1-3 weeks.  Please call us at [phone] if you have not heard about the biopsies in 3 weeks.    SIGNATURES/CONFIDENTIALITY: You and/or your care partner have signed paperwork which will be entered into your electronic medical record.  These signatures attest to the fact that that the information above on your After Visit Summary has been reviewed and is understood.  Full responsibility of the confidentiality of this discharge information lies with you and/or your care-partner.  Await pathology results

## 2015-09-23 ENCOUNTER — Telehealth: Payer: Self-pay | Admitting: *Deleted

## 2015-09-23 NOTE — Telephone Encounter (Signed)
  Follow up Call-  Call back number 09/22/2015  Post procedure Call Back phone  # [phone] cell  Permission to leave phone message Yes     Patient questions:  Do you have a fever, pain , or abdominal swelling? No. Pain Score  0 *  Have you tolerated food without any problems? Yes.    Have you been able to return to your normal activities? Yes.    Do you have any questions about your discharge instructions: Diet   No. Medications  No. Follow up visit  No.  Do you have questions or concerns about your Care? No.  Actions: * If pain score is 4 or above: No action needed, pain <4.

## 2015-09-30 ENCOUNTER — Encounter: Payer: Self-pay | Admitting: Gastroenterology

## 2015-10-01 ENCOUNTER — Encounter: Payer: Self-pay | Admitting: Family Medicine

## 2015-10-01 DIAGNOSIS — D126 Benign neoplasm of colon, unspecified: Secondary | ICD-10-CM | POA: Insufficient documentation

## 2015-11-17 ENCOUNTER — Encounter: Payer: Self-pay | Admitting: Family Medicine

## 2015-11-17 ENCOUNTER — Ambulatory Visit (INDEPENDENT_AMBULATORY_CARE_PROVIDER_SITE_OTHER): Admitting: Family Medicine

## 2015-11-17 VITALS — BP 130/80 | HR 81 | Temp 97.8°F | Wt 279.0 lb

## 2015-11-17 DIAGNOSIS — I10 Essential (primary) hypertension: Secondary | ICD-10-CM

## 2015-11-17 DIAGNOSIS — E785 Hyperlipidemia, unspecified: Secondary | ICD-10-CM | POA: Diagnosis not present

## 2015-11-17 DIAGNOSIS — E669 Obesity, unspecified: Secondary | ICD-10-CM | POA: Insufficient documentation

## 2015-11-17 DIAGNOSIS — M170 Bilateral primary osteoarthritis of knee: Secondary | ICD-10-CM

## 2015-11-17 DIAGNOSIS — N529 Male erectile dysfunction, unspecified: Secondary | ICD-10-CM | POA: Diagnosis not present

## 2015-11-17 MED ORDER — TADALAFIL 20 MG PO TABS: 20.0000 mg | ORAL_TABLET | ORAL | Status: AC | PRN

## 2015-11-17 NOTE — Assessment & Plan Note (Signed)
S: counseling provided 24-hr recall  B ( AM)- oatmeal with honey. Water to drink. Orange.  Snk ( AM)- some water L ( PM)- nutrigrain bar x2 and some water.  Snk ( PM)-  D ( PM)- collared greens, brown rice, boneless chicken thighs on [employer]orge [employer], water to drink Snk ( PM)- Klondike bar 30 minutes after dinner and later some popcorn and another klondike. Drinking water A/P: discussed how 1 klondike would take 2 hours of exercise to correct for and how dietary changes are essential to weight loss as well. Set a goal of 12 lbs down by follow up (267). Hopeful to help with other comorbidities. Can snack near bedtime but advised this to be veggie or fruit snacks if needed. Doing well in regards to water only

## 2015-11-17 NOTE — Assessment & Plan Note (Signed)
S: he does not have major issues with viagra but wants to see if cialis would give him some urinary symptom relief A/P: discussed unlikely to help unless taking daily. Written as 20mg  but advised 1/2 a pill to start. Has urology follow up soon- can discuss further with Dr. Loel Lofty

## 2015-11-17 NOTE — Patient Instructions (Signed)
Great job with walking but lets try to do 5x a week  Let's set a goal again of 12 lbs in 4 months to lose.   Blood pressure looks great today without medicine

## 2015-11-17 NOTE — Progress Notes (Signed)
Garret Reddish, MD  Subjective:  Justin Winters is a 65 y.o. year old very pleasant male patient who presents for/with See problem oriented charting ROS- No chest pain or shortness of breath. No headache or blurry vision. Does have erectile dysfuntion.   Past Medical History-  Patient Active Problem List   Diagnosis Date Noted  . Prostate cancer ([place]) 05/02/2012    Priority: Medium  . Headache 06/24/2010    Priority: Medium  . Essential hypertension 06/24/2010    Priority: Medium  . Hyperlipidemia 11/17/2009    Priority: Medium  . PTSD 06/24/2009    Priority: Medium  . Sleep apnea 06/24/2009    Priority: Medium  . Obesity 11/17/2015    Priority: Low  . Adenomatous colon polyp 10/01/2015    Priority: Low  . 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 Osteoarthritis of knee 06/24/2009    Priority: Low    Medications- reviewed and updated Current Outpatient Prescriptions  Medication Sig Dispense Refill  . geriatric multivitamins-minerals (ELDERTONIC/GEVRABON) ELIX Take 15 mLs by mouth daily.    . rosuvastatin (CRESTOR) 20 MG tablet Take 1 tablet (20 mg total) by mouth 2 (two) times a week. Three time a week 30 tablet 11  . tadalafil (CIALIS) 20 MG tablet Take 1 tablet (20 mg total) by mouth every other day as needed for erectile dysfunction. 10 tablet 11   No current facility-administered medications for this visit.    Objective: BP 130/80 mmHg  Pulse 81  Temp(Src) 97.8 F (36.6 C)  Wt 279 lb (126.554 kg) Gen: NAD, resting comfortably CV: RRR no murmurs rubs or gallops Lungs: CTAB no crackles, wheeze, rhonchi Abdomen: soft/nontender/nondistended/normal bowel sounds. obese Ext: no edema Skin: warm, dry, no rash Neuro: grossly normal, moves all extremities  Assessment/Plan:  ERECTILE DYSFUNCTION, ORGANIC S: he does not have major issues with viagra but wants to see if cialis would give him some urinary symptom relief A/P: discussed unlikely to  help unless taking daily. Written as 20mg  but advised 1/2 a pill to start. Has urology follow up soon- can discuss further with Dr. Loel Lofty   Hyperlipidemia S: poorly controlled on crestor 2x a week- working on dietary and exercise changes. No myalgias.  Lab Results  Component Value Date   CHOL 217* 12/29/2014   HDL 45.60 12/29/2014   LDLCALC 154* 12/29/2014   LDLDIRECT 154.9 09/02/2013   TRIG 87.0 12/29/2014   CHOLHDL 5 12/29/2014   A/P: will get updated labs at 4 months CPE. Continue lifestyle changes    Essential hypertension S: controlled without medication as long as walking regularly BP Readings from Last 3 Encounters:  11/17/15 130/80  09/22/15 125/77  07/14/15 122/90  A/P:Continue without meds. Goal 12 lbs down in 4 months- dietary in addition to already reasonable walking habits.    Obesity S: counseling provided 24-hr recall  B ( AM)- oatmeal with honey. Water to drink. Orange.  Snk ( AM)- some water L ( PM)- nutrigrain bar x2 and some water.  Snk ( PM)-  D ( PM)- collared greens, brown rice, boneless chicken thighs on [employer], water to drink Snk ( PM)- Klondike bar 30 minutes after dinner and later some popcorn and another klondike. Drinking water A/P: discussed how 1 klondike would take 2 hours of exercise to correct for and how dietary changes are essential to weight loss as well. Set a goal of 12 lbs down by follow up (267). Hopeful to help with other comorbidities. Can snack  near bedtime but advised this to be veggie or fruit snacks if needed. Doing well in regards to water only  Osteoarthritis of knee S: Bilateral. Menthol gel, advil, voltaren gel helps some A/P: discussed role of weight loss in management. Check GFR at follow up given nsaids    Return in about 4 months (around 03/18/2016) for physical with labs a few days before. Return precautions advised.   Meds ordered this encounter  Medications  . tadalafil (CIALIS) 20 MG tablet    Sig: Take  1 tablet (20 mg total) by mouth every other day as needed for erectile dysfunction.    Dispense:  10 tablet    Refill:  11

## 2015-11-17 NOTE — Assessment & Plan Note (Signed)
S: controlled without medication as long as walking regularly BP Readings from Last 3 Encounters:  11/17/15 130/80  09/22/15 125/77  07/14/15 122/90  A/P:Continue without meds. Goal 12 lbs down in 4 months- dietary in addition to already reasonable walking habits.

## 2015-11-17 NOTE — Assessment & Plan Note (Signed)
S: poorly controlled on crestor 2x a week- working on dietary and exercise changes. No myalgias.  Lab Results  Component Value Date   CHOL 217* 12/29/2014   HDL 45.60 12/29/2014   LDLCALC 154* 12/29/2014   LDLDIRECT 154.9 09/02/2013   TRIG 87.0 12/29/2014   CHOLHDL 5 12/29/2014   A/P: will get updated labs at 4 months CPE. Continue lifestyle changes

## 2015-11-17 NOTE — Assessment & Plan Note (Signed)
S: Bilateral. Menthol gel, advil, voltaren gel helps some A/P: discussed role of weight loss in management. Check GFR at follow up given nsaids

## 2016-03-16 ENCOUNTER — Other Ambulatory Visit

## 2016-03-23 ENCOUNTER — Encounter: Admitting: Family Medicine

## 2017-05-31 ENCOUNTER — Ambulatory Visit: Payer: Self-pay | Admitting: Orthopedic Surgery

## 2017-06-27 ENCOUNTER — Ambulatory Visit: Payer: Self-pay | Admitting: Orthopedic Surgery

## 2017-06-27 NOTE — H&P (Signed)
TOTAL HIP ADMISSION H&P  Patient is admitted for left total hip arthroplasty.  Subjective:  Chief Complaint: left hip pain  HPI: Justin Winters, 66 y.o. male, has a history of pain and functional disability in the left hip(s) due to arthritis and patient has failed non-surgical conservative treatments for greater than 12 weeks to include NSAID's and/or analgesics, flexibility and strengthening excercises, use of assistive devices, weight reduction as appropriate and activity modification.  Onset of symptoms was gradual starting 2 years ago with gradually worsening course since that time.The patient noted no past surgery on the left hip(s).  Patient currently rates pain in the left hip at 10 out of 10 with activity. Patient has night pain, worsening of pain with activity and weight bearing, pain that interfers with activities of daily living, pain with passive range of motion and crepitus. Patient has evidence of subchondral cysts, subchondral sclerosis, periarticular osteophytes and joint space narrowing by imaging studies. This condition presents safety issues increasing the risk of falls.  There is no current active infection.  Patient Active Problem List   Diagnosis Date Noted  . Obesity 11/17/2015  . Adenomatous colon polyp 10/01/2015  . Prostate cancer ([place]) 05/02/2012  . Headache 06/24/2010  . Essential hypertension 06/24/2010  . Hyperlipidemia 11/17/2009  . 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 PTSD 06/24/2009  . Osteoarthritis of knee 06/24/2009  . Sleep apnea 06/24/2009   Past Medical History:  Diagnosis Date  . Anxiety   . Arthritis   . Depression   . Groin strain   . Hx of radiation therapy 07/20/12   prostate seed implant  . Hyperlipidemia   . Hypogonadism male   . Post-traumatic stress syndrome   . Prostate cancer ([place]) 05/02/12   gleason 6, volume 54 cc, 2011 PSA 3.0  . Sleep apnea    has cpap- noncompliant    Past Surgical History:  Procedure Laterality Date   . RADIOACTIVE SEED IMPLANT  07/20/2012   Procedure: RADIOACTIVE SEED IMPLANT;  Surgeon: Claybon Jabs, MD;  Location: [facility];  Service: Urology;  Laterality: N/A;  second timeout @ 1129   . TONSILLECTOMY    . WISDOM TOOTH EXTRACTION      Current Outpatient Prescriptions  Medication Sig Dispense Refill Last Dose  . geriatric multivitamins-minerals (ELDERTONIC/GEVRABON) ELIX Take 15 mLs by mouth daily.   Taking  . rosuvastatin (CRESTOR) 20 MG tablet Take 1 tablet (20 mg total) by mouth 2 (two) times a week. Three time a week 30 tablet 11 Taking  . tadalafil (CIALIS) 20 MG tablet Take 1 tablet (20 mg total) by mouth every other day as needed for erectile dysfunction. 10 tablet 11    No current facility-administered medications for this visit.    No Known Allergies  Social History  Substance Use Topics  . Smoking status: Never Smoker  . Smokeless tobacco: Never Used  . Alcohol use No    Family History  Problem Relation Age of Onset  . Alzheimer's disease Mother   . Heart disease Father        89 stent, smoker  . Hyperlipidemia Father   . Hypertension Sister   . Colon cancer Neg Hx   . Colon polyps Neg Hx   . Esophageal cancer Neg Hx   . Rectal cancer Neg Hx   . Stomach cancer Neg Hx      Review of Systems  Constitutional: Negative.   HENT: Negative.   Eyes: Negative.   Respiratory: Negative.  Cardiovascular: Negative.   Gastrointestinal: Negative.   Genitourinary: Positive for frequency.  Musculoskeletal: Positive for joint pain.  Skin: Negative.   Neurological: Negative.   Endo/Heme/Allergies: Negative.   Psychiatric/Behavioral: Negative.     Objective:  Physical Exam  Constitutional: He is oriented to person, place, and time. He appears well-developed and well-nourished.  HENT:  Head: Normocephalic and atraumatic.  Eyes: Pupils are equal, round, and reactive to light. Conjunctivae and EOM are normal.  Neck: Normal range of motion. Neck  supple.  Cardiovascular: Normal rate, regular rhythm and intact distal pulses.   Respiratory: Effort normal. No respiratory distress.  GI: Soft. He exhibits no distension.  Genitourinary:  Genitourinary Comments: deferred  Neurological: He is alert and oriented to person, place, and time. He has normal reflexes.  Skin: Skin is warm and dry.  Psychiatric: He has a normal mood and affect. His behavior is normal. Judgment and thought content normal.    Vital signs in last 24 hours: @VSRANGES @  Labs:   Estimated body mass index is 35.82 kg/m as calculated from the following:   Height as of 09/22/15: 6\' 2"  (1.88 m).   Weight as of 11/17/15: 126.6 kg (279 lb).   Imaging Review Plain radiographs demonstrate severe degenerative joint disease of the left hip(s). The bone quality appears to be adequate for age and reported activity level.  Assessment/Plan:  End stage arthritis, left hip(s)  The patient history, physical examination, clinical judgement of the provider and imaging studies are consistent with end stage degenerative joint disease of the left hip(s) and total hip arthroplasty is deemed medically necessary. The treatment options including medical management, injection therapy, arthroscopy and arthroplasty were discussed at length. The risks and benefits of total hip arthroplasty were presented and reviewed. The risks due to aseptic loosening, infection, stiffness, dislocation/subluxation,  thromboembolic complications and other imponderables were discussed.  The patient acknowledged the explanation, agreed to proceed with the plan and consent was signed. Patient is being admitted for inpatient treatment for surgery, pain control, PT, OT, prophylactic antibiotics, VTE prophylaxis, progressive ambulation and ADL's and discharge planning.The patient is planning to be discharged home with HEP. Needs DME.

## 2017-06-27 NOTE — H&P (View-Only) (Signed)
TOTAL HIP ADMISSION H&P  Patient is admitted for left total hip arthroplasty.  Subjective:  Chief Complaint: left hip pain  HPI: Justin Winters, 66 y.o. male, has a history of pain and functional disability in the left hip(s) due to arthritis and patient has failed non-surgical conservative treatments for greater than 12 weeks to include NSAID's and/or analgesics, flexibility and strengthening excercises, use of assistive devices, weight reduction as appropriate and activity modification.  Onset of symptoms was gradual starting 2 years ago with gradually worsening course since that time.The patient noted no past surgery on the left hip(s).  Patient currently rates pain in the left hip at 10 out of 10 with activity. Patient has night pain, worsening of pain with activity and weight bearing, pain that interfers with activities of daily living, pain with passive range of motion and crepitus. Patient has evidence of subchondral cysts, subchondral sclerosis, periarticular osteophytes and joint space narrowing by imaging studies. This condition presents safety issues increasing the risk of falls.  There is no current active infection.  Patient Active Problem List   Diagnosis Date Noted  . Obesity 11/17/2015  . Adenomatous colon polyp 10/01/2015  . Prostate cancer ([place]) 05/02/2012  . Headache 06/24/2010  . Essential hypertension 06/24/2010  . Hyperlipidemia 11/17/2009  . 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 PTSD 06/24/2009  . Osteoarthritis of knee 06/24/2009  . Sleep apnea 06/24/2009   Past Medical History:  Diagnosis Date  . Anxiety   . Arthritis   . Depression   . Groin strain   . Hx of radiation therapy 07/20/12   prostate seed implant  . Hyperlipidemia   . Hypogonadism male   . Post-traumatic stress syndrome   . Prostate cancer ([place]) 05/02/12   gleason 6, volume 54 cc, 2011 PSA 3.0  . Sleep apnea    has cpap- noncompliant    Past Surgical History:  Procedure Laterality Date   . RADIOACTIVE SEED IMPLANT  07/20/2012   Procedure: RADIOACTIVE SEED IMPLANT;  Surgeon: Claybon Jabs, MD;  Location: [facility];  Service: Urology;  Laterality: N/A;  second timeout @ 1129   . TONSILLECTOMY    . WISDOM TOOTH EXTRACTION      Current Outpatient Prescriptions  Medication Sig Dispense Refill Last Dose  . geriatric multivitamins-minerals (ELDERTONIC/GEVRABON) ELIX Take 15 mLs by mouth daily.   Taking  . rosuvastatin (CRESTOR) 20 MG tablet Take 1 tablet (20 mg total) by mouth 2 (two) times a week. Three time a week 30 tablet 11 Taking  . tadalafil (CIALIS) 20 MG tablet Take 1 tablet (20 mg total) by mouth every other day as needed for erectile dysfunction. 10 tablet 11    No current facility-administered medications for this visit.    No Known Allergies  Social History  Substance Use Topics  . Smoking status: Never Smoker  . Smokeless tobacco: Never Used  . Alcohol use No    Family History  Problem Relation Age of Onset  . Alzheimer's disease Mother   . Heart disease Father        17 stent, smoker  . Hyperlipidemia Father   . Hypertension Sister   . Colon cancer Neg Hx   . Colon polyps Neg Hx   . Esophageal cancer Neg Hx   . Rectal cancer Neg Hx   . Stomach cancer Neg Hx      Review of Systems  Constitutional: Negative.   HENT: Negative.   Eyes: Negative.   Respiratory: Negative.  Cardiovascular: Negative.   Gastrointestinal: Negative.   Genitourinary: Positive for frequency.  Musculoskeletal: Positive for joint pain.  Skin: Negative.   Neurological: Negative.   Endo/Heme/Allergies: Negative.   Psychiatric/Behavioral: Negative.     Objective:  Physical Exam  Constitutional: He is oriented to person, place, and time. He appears well-developed and well-nourished.  HENT:  Head: Normocephalic and atraumatic.  Eyes: Pupils are equal, round, and reactive to light. Conjunctivae and EOM are normal.  Neck: Normal range of motion. Neck  supple.  Cardiovascular: Normal rate, regular rhythm and intact distal pulses.   Respiratory: Effort normal. No respiratory distress.  GI: Soft. He exhibits no distension.  Genitourinary:  Genitourinary Comments: deferred  Neurological: He is alert and oriented to person, place, and time. He has normal reflexes.  Skin: Skin is warm and dry.  Psychiatric: He has a normal mood and affect. His behavior is normal. Judgment and thought content normal.    Vital signs in last 24 hours: @VSRANGES @  Labs:   Estimated body mass index is 35.82 kg/m as calculated from the following:   Height as of 09/22/15: 6\' 2"  (1.88 m).   Weight as of 11/17/15: 126.6 kg (279 lb).   Imaging Review Plain radiographs demonstrate severe degenerative joint disease of the left hip(s). The bone quality appears to be adequate for age and reported activity level.  Assessment/Plan:  End stage arthritis, left hip(s)  The patient history, physical examination, clinical judgement of the provider and imaging studies are consistent with end stage degenerative joint disease of the left hip(s) and total hip arthroplasty is deemed medically necessary. The treatment options including medical management, injection therapy, arthroscopy and arthroplasty were discussed at length. The risks and benefits of total hip arthroplasty were presented and reviewed. The risks due to aseptic loosening, infection, stiffness, dislocation/subluxation,  thromboembolic complications and other imponderables were discussed.  The patient acknowledged the explanation, agreed to proceed with the plan and consent was signed. Patient is being admitted for inpatient treatment for surgery, pain control, PT, OT, prophylactic antibiotics, VTE prophylaxis, progressive ambulation and ADL's and discharge planning.The patient is planning to be discharged home with HEP. Needs DME.

## 2017-07-13 NOTE — Patient Instructions (Addendum)
Justin Winters  07/13/2017   Your procedure is scheduled on: 07-20-17   Report to [facility] Main  Entrance Take [place]  [address] to 3rd floor to  [place] at 5:30 AM.   Call this number if you have problems the morning of surgery [phone]    Remember: ONLY 1 PERSON MAY GO WITH YOU TO SHORT STAY TO GET  READY MORNING OF [place].  Do not eat food or drink liquids :After Midnight.     Take these medicines the morning of surgery with A SIP OF WATER: None               You may not have any metal on your body including hair pins and              piercings  Do not wear jewelry, lotions, powders or perfumes, deodorant             Men may shave face and neck.   Do not bring valuables to the hospital. [place].  Contacts, dentures or bridgework may not be worn into surgery.  Leave suitcase in the car. After surgery it may be brought to your room.   Please bring your mask and tubing for your CPAP machine.               Please read over the following fact sheets you were given: _____________________________________________________________________             [facility] - Preparing for Surgery Before surgery, you can play an important role.  Because skin is not sterile, your skin needs to be as free of germs as possible.  You can reduce the number of germs on your skin by washing with CHG (chlorahexidine gluconate) soap before surgery.  CHG is an antiseptic cleaner which kills germs and bonds with the skin to continue killing germs even after washing. Please DO NOT use if you have an allergy to CHG or antibacterial soaps.  If your skin becomes reddened/irritated stop using the CHG and inform your nurse when you arrive at Short Stay. Do not shave (including legs and underarms) for at least 48 hours prior to the first CHG shower.  You may shave your face/neck. Please follow these instructions  carefully:  1.  Shower with CHG Soap the night before surgery and the  morning of Surgery.  2.  If you choose to wash your hair, wash your hair first as usual with your  normal  shampoo.  3.  After you shampoo, rinse your hair and body thoroughly to remove the  shampoo.                           4.  Use CHG as you would any other liquid soap.  You can apply chg directly  to the skin and wash                       Gently with a scrungie or clean washcloth.  5.  Apply the CHG Soap to your body ONLY FROM THE NECK DOWN.   Do not use on face/ open  Wound or open sores. Avoid contact with eyes, ears mouth and genitals (private parts).                       Wash face,  Genitals (private parts) with your normal soap.             6.  Wash thoroughly, paying special attention to the area where your surgery  will be performed.  7.  Thoroughly rinse your body with warm water from the neck down.  8.  DO NOT shower/wash with your normal soap after using and rinsing off  the CHG Soap.                9.  Pat yourself dry with a clean towel.            10.  Wear clean pajamas.            11.  Place clean sheets on your bed the night of your first shower and do not  sleep with pets. Day of Surgery : Do not apply any lotions/deodorants the morning of surgery.  Please wear clean clothes to the hospital/surgery center.  FAILURE TO FOLLOW THESE INSTRUCTIONS MAY RESULT IN THE CANCELLATION OF YOUR SURGERY PATIENT SIGNATURE_________________________________  NURSE SIGNATURE__________________________________  ________________________________________________________________________   Justin Winters  An incentive spirometer is a tool that can help keep your lungs clear and active. This tool measures how well you are filling your lungs with each breath. Taking long deep breaths may help reverse or decrease the chance of developing breathing (pulmonary) problems (especially infection)  following:  A long period of time when you are unable to move or be active. BEFORE THE PROCEDURE   If the spirometer includes an indicator to show your best effort, your nurse or respiratory therapist will set it to a desired goal.  If possible, sit up straight or lean slightly forward. Try not to slouch.  Hold the incentive spirometer in an upright position. INSTRUCTIONS FOR USE  1. Sit on the edge of your bed if possible, or sit up as far as you can in bed or on a chair. 2. Hold the incentive spirometer in an upright position. 3. Breathe out normally. 4. Place the mouthpiece in your mouth and seal your lips tightly around it. 5. Breathe in slowly and as deeply as possible, raising the piston or the ball toward the top of the column. 6. Hold your breath for 3-5 seconds or for as long as possible. Allow the piston or ball to fall to the bottom of the column. 7. Remove the mouthpiece from your mouth and breathe out normally. 8. Rest for a few seconds and repeat Steps 1 through 7 at least 10 times every 1-2 hours when you are awake. Take your time and take a few normal breaths between deep breaths. 9. The spirometer may include an indicator to show your best effort. Use the indicator as a goal to work toward during each repetition. 10. After each set of 10 deep breaths, practice coughing to be sure your lungs are clear. If you have an incision (the cut made at the time of surgery), support your incision when coughing by placing a pillow or rolled up towels firmly against it. Once you are able to get out of bed, walk around indoors and cough well. You may stop using the incentive spirometer when instructed by your caregiver.  RISKS AND COMPLICATIONS  Take your time so you do not get  dizzy or light-headed.  If you are in pain, you may need to take or ask for pain medication before doing incentive spirometry. It is harder to take a deep breath if you are having pain. AFTER USE  Rest and  breathe slowly and easily.  It can be helpful to keep track of a log of your progress. Your caregiver can provide you with a simple table to help with this. If you are using the spirometer at home, follow these instructions: [place] IF:   You are having difficultly using the spirometer.  You have trouble using the spirometer as often as instructed.  Your pain medication is not giving enough relief while using the spirometer.  You develop fever of 100.5 F (38.1 C) or higher. SEEK IMMEDIATE MEDICAL CARE IF:   You cough up bloody sputum that had not been present before.  You develop fever of 102 F (38.9 C) or greater.  You develop worsening pain at or near the incision site. MAKE SURE YOU:   Understand these instructions.  Will watch your condition.  Will get help right away if you are not doing well or get worse. Document Released: 01/09/2007 Document Revised: 11/21/2011 Document Reviewed: 03/12/2007 Exit[employer] Patient Information 2014 [employer], [place].   ________________________________________________________________________  WHAT IS A BLOOD TRANSFUSION? Blood Transfusion Information  A transfusion is the replacement of blood or some of its parts. Blood is made up of multiple cells which provide different functions.  Red blood cells carry oxygen and are used for blood loss replacement.  White blood cells fight against infection.  Platelets control bleeding.  Plasma helps clot blood.  Other blood products are available for specialized needs, such as hemophilia or other clotting disorders. BEFORE THE TRANSFUSION  Who gives blood for transfusions?   Healthy volunteers who are fully evaluated to make sure their blood is safe. This is blood bank blood. Transfusion therapy is the safest it has ever been in the practice of medicine. Before blood is taken from a donor, a complete history is taken to make sure that person has no history of diseases nor engages in  risky social behavior (examples are intravenous drug use or sexual activity with multiple partners). The donor's travel history is screened to minimize risk of transmitting infections, such as malaria. The donated blood is tested for signs of infectious diseases, such as HIV and hepatitis. The blood is then tested to be sure it is compatible with you in order to minimize the chance of a transfusion reaction. If you or a relative donates blood, this is often done in anticipation of surgery and is not appropriate for emergency situations. It takes many days to process the donated blood. RISKS AND COMPLICATIONS Although transfusion therapy is very safe and saves many lives, the main dangers of transfusion include:   Getting an infectious disease.  Developing a transfusion reaction. This is an allergic reaction to something in the blood you were given. Every precaution is taken to prevent this. The decision to have a blood transfusion has been considered carefully by your caregiver before blood is given. Blood is not given unless the benefits outweigh the risks. AFTER THE TRANSFUSION  Right after receiving a blood transfusion, you will usually feel much better and more energetic. This is especially true if your red blood cells have gotten low (anemic). The transfusion raises the level of the red blood cells which carry oxygen, and this usually causes an energy increase.  The nurse administering the transfusion will  monitor you carefully for complications. HOME CARE INSTRUCTIONS  No special instructions are needed after a transfusion. You may find your energy is better. Speak with your caregiver about any limitations on activity for underlying diseases you may have. SEEK MEDICAL CARE IF:   Your condition is not improving after your transfusion.  You develop redness or irritation at the intravenous (IV) site. SEEK IMMEDIATE MEDICAL CARE IF:  Any of the following symptoms occur over the next 12  hours:  Shaking chills.  You have a temperature by mouth above 102 F (38.9 C), not controlled by medicine.  Chest, back, or muscle pain.  People around you feel you are not acting correctly or are confused.  Shortness of breath or difficulty breathing.  Dizziness and fainting.  You get a rash or develop hives.  You have a decrease in urine output.  Your urine turns a dark color or changes to pink, red, or brown. Any of the following symptoms occur over the next 10 days:  You have a temperature by mouth above 102 F (38.9 C), not controlled by medicine.  Shortness of breath.  Weakness after normal activity.  The white part of the eye turns yellow (jaundice).  You have a decrease in the amount of urine or are urinating less often.  Your urine turns a dark color or changes to pink, red, or brown. Document Released: 08/26/2000 Document Revised: 11/21/2011 Document Reviewed: 04/14/2008 [facility] Patient Information 2014 [place], [place].  _______________________________________________________________________

## 2017-07-13 NOTE — Progress Notes (Signed)
05-26-17 Surgical clearance from Dr. Santo Held [facility] in [place]  05-26-17 EKG on chart

## 2017-07-14 ENCOUNTER — Encounter (HOSPITAL_COMMUNITY)
Admission: RE | Admit: 2017-07-14 | Discharge: 2017-07-14 | Disposition: A | Discharge status: Home / Self Care | Payer: Non-veteran care | Source: Ambulatory Visit | Attending: Orthopedic Surgery | Admitting: Orthopedic Surgery

## 2017-07-14 ENCOUNTER — Encounter (HOSPITAL_COMMUNITY): Payer: Self-pay

## 2017-07-14 DIAGNOSIS — Z01812 Encounter for preprocedural laboratory examination: Secondary | ICD-10-CM | POA: Diagnosis not present

## 2017-07-14 DIAGNOSIS — M1612 Unilateral primary osteoarthritis, left hip: Secondary | ICD-10-CM | POA: Diagnosis not present

## 2017-07-14 DIAGNOSIS — Z0183 Encounter for blood typing: Secondary | ICD-10-CM | POA: Diagnosis not present

## 2017-07-14 LAB — BASIC METABOLIC PANEL
ANION GAP: 7 (ref 5–15)
BUN: 21 mg/dL — ABNORMAL HIGH (ref 6–20)
CALCIUM: 9.3 mg/dL (ref 8.9–10.3)
CHLORIDE: 108 mmol/L (ref 101–111)
CO2: 24 mmol/L (ref 22–32)
Creatinine, Ser: 1.16 mg/dL (ref 0.61–1.24)
GFR calc non Af Amer: >60 mL/min (ref >60)
Glucose, Bld: 103 mg/dL — ABNORMAL HIGH (ref 65–99)
Potassium: 4.1 mmol/L (ref 3.5–5.1)
SODIUM: 139 mmol/L (ref 135–145)

## 2017-07-14 LAB — SURGICAL PCR SCREEN
MRSA, PCR: NEGATIVE
Staphylococcus aureus: NEGATIVE

## 2017-07-14 LAB — CBC
HCT: 43.9 % (ref 39.0–52.0)
HEMOGLOBIN: 14.9 g/dL (ref 13.0–17.0)
MCH: 34.3 pg — AB (ref 26.0–34.0)
MCHC: 33.9 g/dL (ref 30.0–36.0)
MCV: 100.9 fL — ABNORMAL HIGH (ref 78.0–100.0)
Platelets: 185 10*3/uL (ref 150–400)
RBC: 4.35 MIL/uL (ref 4.22–5.81)
RDW: 12.5 % (ref 11.5–15.5)
WBC: 5.5 10*3/uL (ref 4.0–10.5)

## 2017-07-14 LAB — ABO/RH: ABO/RH(D): O POS

## 2017-07-19 MED ORDER — CEFAZOLIN SODIUM 10 G IJ SOLR
3.0000 g | INTRAMUSCULAR | Status: AC
  Administered 2017-07-20: 3 g via INTRAVENOUS
  Filled 2017-07-19 (×2): qty 3000

## 2017-07-19 NOTE — Anesthesia Preprocedure Evaluation (Signed)
Anesthesia Evaluation  Patient identified by MRN, date of birth, ID band Patient awake    Reviewed: Allergy & Precautions, H&P , NPO status , Patient's Chart, lab work & pertinent test results  Airway Mallampati: II  TM Distance: >3 FB Neck ROM: Full    Dental no notable dental hx.    Pulmonary sleep apnea ,    Pulmonary exam normal breath sounds clear to auscultation       Cardiovascular Exercise Tolerance: Good hypertension, Normal cardiovascular exam Rhythm:Regular Rate:Normal  H/O elevated BP without diagnosis of htn. He states his BP is only intermittently elevated. He states his primary care MD is following his BP.   Neuro/Psych  Headaches, PSYCHIATRIC DISORDERS Anxiety Depression  Neuromuscular disease    GI/Hepatic negative GI ROS, Neg liver ROS,   Endo/Other  negative endocrine ROS  Renal/GU negative Renal ROS     Musculoskeletal  (+) Arthritis ,   Abdominal (+) + obese,   Peds  Hematology negative hematology ROS (+)   Anesthesia Other Findings   Reproductive/Obstetrics negative OB ROS                             Anesthesia Physical  Anesthesia Plan  ASA: II  Anesthesia Plan: Spinal   Post-op Pain Management:    Induction:   PONV Risk Score and Plan: 2 and Dexamethasone, Ondansetron, Treatment may vary due to age or medical condition and Propofol infusion  Airway Management Planned:   Additional Equipment:   Intra-op Plan:   Post-operative Plan:   Informed Consent: I have reviewed the patients History and Physical, chart, labs and discussed the procedure including the risks, benefits and alternatives for the proposed anesthesia with the patient or authorized representative who has indicated his/her understanding and acceptance.   Dental advisory given  Plan Discussed with: CRNA  Anesthesia Plan Comments:         Anesthesia Quick Evaluation

## 2017-07-20 ENCOUNTER — Encounter (HOSPITAL_COMMUNITY)
Admission: RE | Disposition: A | Discharge status: Home / Self Care | Payer: Self-pay | Source: Home / Self Care | Attending: Orthopedic Surgery

## 2017-07-20 ENCOUNTER — Encounter (HOSPITAL_COMMUNITY): Payer: Self-pay | Admitting: Emergency Medicine

## 2017-07-20 ENCOUNTER — Inpatient Hospital Stay (HOSPITAL_COMMUNITY)
Admission: RE | Admit: 2017-07-20 | Discharge: 2017-07-21 | DRG: 470 | Disposition: A | Discharge status: Home / Self Care | Payer: Non-veteran care | Attending: Orthopedic Surgery | Admitting: Orthopedic Surgery

## 2017-07-20 ENCOUNTER — Inpatient Hospital Stay (HOSPITAL_COMMUNITY): Payer: Non-veteran care | Admitting: Anesthesiology

## 2017-07-20 ENCOUNTER — Inpatient Hospital Stay (HOSPITAL_COMMUNITY): Payer: Non-veteran care

## 2017-07-20 ENCOUNTER — Other Ambulatory Visit: Payer: Self-pay

## 2017-07-20 DIAGNOSIS — Z79899 Other long term (current) drug therapy: Secondary | ICD-10-CM | POA: Diagnosis not present

## 2017-07-20 DIAGNOSIS — E291 Testicular hypofunction: Secondary | ICD-10-CM | POA: Diagnosis present

## 2017-07-20 DIAGNOSIS — N521 Erectile dysfunction due to diseases classified elsewhere: Secondary | ICD-10-CM | POA: Diagnosis present

## 2017-07-20 DIAGNOSIS — F4310 Post-traumatic stress disorder, unspecified: Secondary | ICD-10-CM | POA: Diagnosis present

## 2017-07-20 DIAGNOSIS — E785 Hyperlipidemia, unspecified: Secondary | ICD-10-CM | POA: Diagnosis present

## 2017-07-20 DIAGNOSIS — Z419 Encounter for procedure for purposes other than remedying health state, unspecified: Secondary | ICD-10-CM

## 2017-07-20 DIAGNOSIS — F419 Anxiety disorder, unspecified: Secondary | ICD-10-CM | POA: Diagnosis present

## 2017-07-20 DIAGNOSIS — M25552 Pain in left hip: Secondary | ICD-10-CM | POA: Diagnosis present

## 2017-07-20 DIAGNOSIS — Z923 Personal history of irradiation: Secondary | ICD-10-CM | POA: Diagnosis not present

## 2017-07-20 DIAGNOSIS — Z9119 Patient's noncompliance with other medical treatment and regimen: Secondary | ICD-10-CM

## 2017-07-20 DIAGNOSIS — G4730 Sleep apnea, unspecified: Secondary | ICD-10-CM | POA: Diagnosis present

## 2017-07-20 DIAGNOSIS — I10 Essential (primary) hypertension: Secondary | ICD-10-CM | POA: Diagnosis present

## 2017-07-20 DIAGNOSIS — M25752 Osteophyte, left hip: Secondary | ICD-10-CM | POA: Diagnosis present

## 2017-07-20 DIAGNOSIS — Z8349 Family history of other endocrine, nutritional and metabolic diseases: Secondary | ICD-10-CM | POA: Diagnosis not present

## 2017-07-20 DIAGNOSIS — Z8546 Personal history of malignant neoplasm of prostate: Secondary | ICD-10-CM | POA: Diagnosis not present

## 2017-07-20 DIAGNOSIS — F329 Major depressive disorder, single episode, unspecified: Secondary | ICD-10-CM | POA: Diagnosis present

## 2017-07-20 DIAGNOSIS — Z09 Encounter for follow-up examination after completed treatment for conditions other than malignant neoplasm: Secondary | ICD-10-CM

## 2017-07-20 DIAGNOSIS — Z8249 Family history of ischemic heart disease and other diseases of the circulatory system: Secondary | ICD-10-CM

## 2017-07-20 DIAGNOSIS — M1612 Unilateral primary osteoarthritis, left hip: Secondary | ICD-10-CM | POA: Diagnosis present

## 2017-07-20 HISTORY — PX: TOTAL HIP ARTHROPLASTY: SHX124

## 2017-07-20 LAB — TYPE AND SCREEN
ABO/RH(D): O POS
Antibody Screen: NEGATIVE

## 2017-07-20 SURGERY — ARTHROPLASTY, HIP, TOTAL, ANTERIOR APPROACH
Anesthesia: Spinal | Site: Hip | Laterality: Left

## 2017-07-20 MED ORDER — ACETAMINOPHEN 325 MG PO TABS: 650.0000 mg | ORAL_TABLET | ORAL | Status: DC | PRN

## 2017-07-20 MED ORDER — ALBUMIN HUMAN 5 % IV SOLN
INTRAVENOUS | Status: AC
  Filled 2017-07-20: qty 250

## 2017-07-20 MED ORDER — MIDAZOLAM HCL 2 MG/2ML IJ SOLN
INTRAMUSCULAR | Status: AC
  Filled 2017-07-20: qty 2

## 2017-07-20 MED ORDER — SODIUM CHLORIDE 0.9 % IR SOLN
Status: DC | PRN
  Administered 2017-07-20: 1000 mL

## 2017-07-20 MED ORDER — BUPIVACAINE HCL (PF) 0.5 % IJ SOLN
INTRAMUSCULAR | Status: AC
  Filled 2017-07-20: qty 60

## 2017-07-20 MED ORDER — DOCUSATE SODIUM 100 MG PO CAPS
100.0000 mg | ORAL_CAPSULE | Freq: Two times a day (BID) | ORAL | Status: DC
  Administered 2017-07-20 – 2017-07-21 (×3): 100 mg via ORAL
  Filled 2017-07-20 (×4): qty 1

## 2017-07-20 MED ORDER — DEXAMETHASONE SODIUM PHOSPHATE 10 MG/ML IJ SOLN
INTRAMUSCULAR | Status: AC
  Filled 2017-07-20: qty 1

## 2017-07-20 MED ORDER — ISOPROPYL ALCOHOL 70 % SOLN
Status: AC
  Filled 2017-07-20: qty 480

## 2017-07-20 MED ORDER — CHLORHEXIDINE GLUCONATE 4 % EX LIQD: 60.0000 mL | Freq: Once | CUTANEOUS | Status: DC

## 2017-07-20 MED ORDER — ONDANSETRON HCL 4 MG/2ML IJ SOLN: 4.0000 mg | Freq: Four times a day (QID) | INTRAMUSCULAR | Status: DC | PRN

## 2017-07-20 MED ORDER — ISOPROPYL ALCOHOL 70 % SOLN
Status: DC | PRN
  Administered 2017-07-20: 1 via TOPICAL

## 2017-07-20 MED ORDER — PHENYLEPHRINE HCL 10 MG/ML IJ SOLN
INTRAMUSCULAR | Status: DC | PRN
  Administered 2017-07-20: 25 ug/min via INTRAVENOUS

## 2017-07-20 MED ORDER — ACETAMINOPHEN 650 MG RE SUPP: 650.0000 mg | RECTAL | Status: DC | PRN

## 2017-07-20 MED ORDER — DIPHENHYDRAMINE HCL 12.5 MG/5ML PO ELIX: 12.5000 mg | ORAL_SOLUTION | ORAL | Status: DC | PRN

## 2017-07-20 MED ORDER — ALBUMIN HUMAN 5 % IV SOLN
INTRAVENOUS | Status: DC | PRN
  Administered 2017-07-20: 09:00:00 via INTRAVENOUS

## 2017-07-20 MED ORDER — ASPIRIN 81 MG PO CHEW
81.0000 mg | CHEWABLE_TABLET | Freq: Two times a day (BID) | ORAL | Status: DC
  Administered 2017-07-20 – 2017-07-21 (×2): 81 mg via ORAL
  Filled 2017-07-20 (×2): qty 1

## 2017-07-20 MED ORDER — WATER FOR IRRIGATION, STERILE IR SOLN
Status: DC | PRN
  Administered 2017-07-20: 3000 mL

## 2017-07-20 MED ORDER — ONDANSETRON HCL 4 MG/2ML IJ SOLN
INTRAMUSCULAR | Status: AC
  Filled 2017-07-20: qty 2

## 2017-07-20 MED ORDER — SODIUM CHLORIDE 0.9 % IV SOLN: INTRAVENOUS | Status: DC

## 2017-07-20 MED ORDER — HYDROMORPHONE HCL 1 MG/ML IJ SOLN
0.5000 mg | INTRAMUSCULAR | Status: DC | PRN
  Administered 2017-07-20 (×2): 1 mg via INTRAVENOUS
  Filled 2017-07-20 (×2): qty 1

## 2017-07-20 MED ORDER — MIDAZOLAM HCL 5 MG/5ML IJ SOLN
INTRAMUSCULAR | Status: DC | PRN
  Administered 2017-07-20 (×2): 1 mg via INTRAVENOUS

## 2017-07-20 MED ORDER — FENTANYL CITRATE (PF) 100 MCG/2ML IJ SOLN: 25.0000 ug | INTRAMUSCULAR | Status: DC | PRN

## 2017-07-20 MED ORDER — SODIUM CHLORIDE 0.9 % IJ SOLN
INTRAMUSCULAR | Status: DC | PRN
  Administered 2017-07-20: 30 mL

## 2017-07-20 MED ORDER — TRANEXAMIC ACID 1000 MG/10ML IV SOLN
1000.0000 mg | Freq: Once | INTRAVENOUS | Status: AC
  Administered 2017-07-20: 15:00:00 1000 mg via INTRAVENOUS
  Filled 2017-07-20: qty 10
  Filled 2017-07-20: qty 1100

## 2017-07-20 MED ORDER — PHENYLEPHRINE HCL 10 MG/ML IJ SOLN
INTRAMUSCULAR | Status: AC
  Filled 2017-07-20: qty 1

## 2017-07-20 MED ORDER — PROPOFOL 10 MG/ML IV BOLUS
INTRAVENOUS | Status: AC
  Filled 2017-07-20: qty 80

## 2017-07-20 MED ORDER — FENTANYL CITRATE (PF) 100 MCG/2ML IJ SOLN
INTRAMUSCULAR | Status: AC
  Filled 2017-07-20: qty 2

## 2017-07-20 MED ORDER — POLYETHYLENE GLYCOL 3350 17 G PO PACK: 17.0000 g | PACK | Freq: Every day | ORAL | Status: DC | PRN

## 2017-07-20 MED ORDER — PHENYLEPHRINE 40 MCG/ML (10ML) SYRINGE FOR IV PUSH (FOR BLOOD PRESSURE SUPPORT)
PREFILLED_SYRINGE | INTRAVENOUS | Status: DC | PRN
  Administered 2017-07-20 (×4): 80 ug via INTRAVENOUS

## 2017-07-20 MED ORDER — METOCLOPRAMIDE HCL 5 MG/ML IJ SOLN: 5.0000 mg | Freq: Three times a day (TID) | INTRAMUSCULAR | Status: DC | PRN

## 2017-07-20 MED ORDER — HYDROCODONE-ACETAMINOPHEN 5-325 MG PO TABS
2.0000 | ORAL_TABLET | ORAL | Status: DC | PRN
  Administered 2017-07-20 – 2017-07-21 (×4): 2 via ORAL
  Filled 2017-07-20 (×4): qty 2

## 2017-07-20 MED ORDER — BUPIVACAINE HCL (PF) 0.5 % IJ SOLN
INTRAMUSCULAR | Status: DC | PRN
  Administered 2017-07-20: 3 mL

## 2017-07-20 MED ORDER — ONDANSETRON HCL 4 MG PO TABS: 4.0000 mg | ORAL_TABLET | Freq: Four times a day (QID) | ORAL | Status: DC | PRN

## 2017-07-20 MED ORDER — PROMETHAZINE HCL 25 MG/ML IJ SOLN: 6.2500 mg | INTRAMUSCULAR | Status: DC | PRN

## 2017-07-20 MED ORDER — OXYCODONE HCL 5 MG PO TABS: 5.0000 mg | ORAL_TABLET | Freq: Once | ORAL | Status: DC | PRN

## 2017-07-20 MED ORDER — KETOROLAC TROMETHAMINE 30 MG/ML IJ SOLN
INTRAMUSCULAR | Status: AC
  Filled 2017-07-20: qty 1

## 2017-07-20 MED ORDER — PHENOL 1.4 % MT LIQD
1.0000 | OROMUCOSAL | Status: DC | PRN
  Filled 2017-07-20: qty 177

## 2017-07-20 MED ORDER — SENNA 8.6 MG PO TABS
2.0000 | ORAL_TABLET | Freq: Every day | ORAL | Status: DC
  Administered 2017-07-20: 21:00:00 17.2 mg via ORAL
  Filled 2017-07-20: qty 2

## 2017-07-20 MED ORDER — ACETAMINOPHEN 10 MG/ML IV SOLN
INTRAVENOUS | Status: AC
  Filled 2017-07-20: qty 100

## 2017-07-20 MED ORDER — ACETAMINOPHEN 10 MG/ML IV SOLN
1000.0000 mg | INTRAVENOUS | Status: AC
  Administered 2017-07-20: 1000 mg via INTRAVENOUS

## 2017-07-20 MED ORDER — KETOROLAC TROMETHAMINE 15 MG/ML IJ SOLN
INTRAMUSCULAR | Status: AC
  Filled 2017-07-20: qty 1

## 2017-07-20 MED ORDER — BUPIVACAINE-EPINEPHRINE (PF) 0.25% -1:200000 IJ SOLN
INTRAMUSCULAR | Status: AC
  Filled 2017-07-20: qty 30

## 2017-07-20 MED ORDER — DEXAMETHASONE SODIUM PHOSPHATE 10 MG/ML IJ SOLN
INTRAMUSCULAR | Status: DC | PRN
  Administered 2017-07-20: 10 mg via INTRAVENOUS

## 2017-07-20 MED ORDER — MEPERIDINE HCL 50 MG/ML IJ SOLN: 6.2500 mg | INTRAMUSCULAR | Status: DC | PRN

## 2017-07-20 MED ORDER — CEFAZOLIN SODIUM-DEXTROSE 2-4 GM/100ML-% IV SOLN
2.0000 g | Freq: Four times a day (QID) | INTRAVENOUS | Status: AC
  Administered 2017-07-20 (×2): 2 g via INTRAVENOUS
  Filled 2017-07-20 (×2): qty 100

## 2017-07-20 MED ORDER — FENTANYL CITRATE (PF) 100 MCG/2ML IJ SOLN
INTRAMUSCULAR | Status: DC | PRN
  Administered 2017-07-20 (×2): 50 ug via INTRAVENOUS

## 2017-07-20 MED ORDER — ONDANSETRON HCL 4 MG/2ML IJ SOLN
INTRAMUSCULAR | Status: DC | PRN
  Administered 2017-07-20: 4 mg via INTRAVENOUS

## 2017-07-20 MED ORDER — BUPIVACAINE-EPINEPHRINE 0.25% -1:200000 IJ SOLN
INTRAMUSCULAR | Status: DC | PRN
  Administered 2017-07-20: 30 mL

## 2017-07-20 MED ORDER — ALUM & MAG HYDROXIDE-SIMETH 200-200-20 MG/5ML PO SUSP: 30.0000 mL | ORAL | Status: DC | PRN

## 2017-07-20 MED ORDER — LACTATED RINGERS IV SOLN
INTRAVENOUS | Status: DC | PRN
  Administered 2017-07-20 (×3): via INTRAVENOUS

## 2017-07-20 MED ORDER — SODIUM CHLORIDE 0.9 % IJ SOLN
INTRAMUSCULAR | Status: AC
  Filled 2017-07-20: qty 50

## 2017-07-20 MED ORDER — PHENYLEPHRINE 40 MCG/ML (10ML) SYRINGE FOR IV PUSH (FOR BLOOD PRESSURE SUPPORT)
PREFILLED_SYRINGE | INTRAVENOUS | Status: AC
  Filled 2017-07-20: qty 10

## 2017-07-20 MED ORDER — POVIDONE-IODINE 10 % EX SWAB
2.0000 "application " | Freq: Once | CUTANEOUS | Status: AC
  Administered 2017-07-20: 2 via TOPICAL

## 2017-07-20 MED ORDER — SODIUM CHLORIDE 0.9 % IV SOLN
INTRAVENOUS | Status: DC
  Administered 2017-07-20: 15:00:00 via INTRAVENOUS

## 2017-07-20 MED ORDER — TRANEXAMIC ACID 1000 MG/10ML IV SOLN
1000.0000 mg | INTRAVENOUS | Status: AC
  Administered 2017-07-20: 1000 mg via INTRAVENOUS
  Filled 2017-07-20: qty 1100

## 2017-07-20 MED ORDER — HYDROCODONE-ACETAMINOPHEN 5-325 MG PO TABS
1.0000 | ORAL_TABLET | ORAL | Status: DC | PRN
  Administered 2017-07-20: 1 via ORAL
  Filled 2017-07-20: qty 1

## 2017-07-20 MED ORDER — METOCLOPRAMIDE HCL 5 MG PO TABS: 5.0000 mg | ORAL_TABLET | Freq: Three times a day (TID) | ORAL | Status: DC | PRN

## 2017-07-20 MED ORDER — DEXAMETHASONE SODIUM PHOSPHATE 10 MG/ML IJ SOLN
10.0000 mg | Freq: Once | INTRAMUSCULAR | Status: AC
  Administered 2017-07-21: 09:00:00 10 mg via INTRAVENOUS
  Filled 2017-07-20: qty 1

## 2017-07-20 MED ORDER — METHOCARBAMOL 500 MG PO TABS
500.0000 mg | ORAL_TABLET | Freq: Four times a day (QID) | ORAL | Status: DC | PRN
  Administered 2017-07-20 – 2017-07-21 (×2): 500 mg via ORAL
  Filled 2017-07-20 (×2): qty 1

## 2017-07-20 MED ORDER — KETOROLAC TROMETHAMINE 30 MG/ML IJ SOLN
INTRAMUSCULAR | Status: DC | PRN
  Administered 2017-07-20: 30 mg

## 2017-07-20 MED ORDER — PROPOFOL 500 MG/50ML IV EMUL
INTRAVENOUS | Status: DC | PRN
  Administered 2017-07-20: 75 ug/kg/min via INTRAVENOUS

## 2017-07-20 MED ORDER — METHOCARBAMOL 1000 MG/10ML IJ SOLN
500.0000 mg | Freq: Four times a day (QID) | INTRAVENOUS | Status: DC | PRN
  Administered 2017-07-20: 500 mg via INTRAVENOUS
  Filled 2017-07-20: qty 550

## 2017-07-20 MED ORDER — BUPIVACAINE HCL (PF) 0.5 % IJ SOLN
INTRAMUSCULAR | Status: AC
  Filled 2017-07-20: qty 30

## 2017-07-20 MED ORDER — OXYCODONE HCL 5 MG/5ML PO SOLN
5.0000 mg | Freq: Once | ORAL | Status: DC | PRN
  Filled 2017-07-20: qty 5

## 2017-07-20 MED ORDER — MENTHOL 3 MG MT LOZG: 1.0000 | LOZENGE | OROMUCOSAL | Status: DC | PRN

## 2017-07-20 MED ORDER — KETOROLAC TROMETHAMINE 15 MG/ML IJ SOLN
15.0000 mg | Freq: Four times a day (QID) | INTRAMUSCULAR | Status: AC
  Administered 2017-07-20 – 2017-07-21 (×4): 15 mg via INTRAVENOUS
  Filled 2017-07-20 (×3): qty 1

## 2017-07-20 SURGICAL SUPPLY — 46 items
BAG DECANTER FOR FLEXI CONT (MISCELLANEOUS) IMPLANT
BAG ZIPLOCK 12X15 (MISCELLANEOUS) IMPLANT
CAPT HIP TOTAL 2 ×3 IMPLANT
CHLORAPREP W/TINT 26ML (MISCELLANEOUS) ×3 IMPLANT
CLOTH BEACON ORANGE TIMEOUT ST (SAFETY) ×3 IMPLANT
COVER PERINEAL POST (MISCELLANEOUS) ×3 IMPLANT
COVER SURGICAL LIGHT HANDLE (MISCELLANEOUS) ×3 IMPLANT
DECANTER SPIKE VIAL GLASS SM (MISCELLANEOUS) ×3 IMPLANT
DERMABOND ADVANCED (GAUZE/BANDAGES/DRESSINGS) ×2
DERMABOND ADVANCED .7 DNX12 (GAUZE/BANDAGES/DRESSINGS) ×1 IMPLANT
DRAPE SHEET LG 3/4 BI-LAMINATE (DRAPES) ×9 IMPLANT
DRAPE STERI IOBAN 125X83 (DRAPES) ×3 IMPLANT
DRAPE U-SHAPE 47X51 STRL (DRAPES) ×6 IMPLANT
DRSG AQUACEL AG ADV 3.5X10 (GAUZE/BANDAGES/DRESSINGS) ×3 IMPLANT
ELECT PENCIL ROCKER SW 15FT (MISCELLANEOUS) ×3 IMPLANT
ELECT REM PT RETURN 15FT ADLT (MISCELLANEOUS) ×3 IMPLANT
GAUZE SPONGE 4X4 12PLY STRL (GAUZE/BANDAGES/DRESSINGS) ×3 IMPLANT
GLOVE BIO SURGEON STRL SZ8.5 (GLOVE) ×6 IMPLANT
GLOVE BIOGEL M STRL SZ7.5 (GLOVE) ×3 IMPLANT
GLOVE BIOGEL PI IND STRL 7.5 (GLOVE) ×7 IMPLANT
GLOVE BIOGEL PI IND STRL 8.5 (GLOVE) ×1 IMPLANT
GLOVE BIOGEL PI INDICATOR 7.5 (GLOVE) ×14
GLOVE BIOGEL PI INDICATOR 8.5 (GLOVE) ×2
GOWN SPEC L3 XXLG W/TWL (GOWN DISPOSABLE) ×6 IMPLANT
GOWN STRL REUS W/TWL LRG LVL3 (GOWN DISPOSABLE) ×9 IMPLANT
HANDPIECE INTERPULSE COAX TIP (DISPOSABLE) ×2
HOLDER FOLEY CATH W/STRAP (MISCELLANEOUS) ×3 IMPLANT
HOOD PEEL AWAY FLYTE STAYCOOL (MISCELLANEOUS) ×6 IMPLANT
MARKER SKIN DUAL TIP RULER LAB (MISCELLANEOUS) ×3 IMPLANT
NEEDLE SPNL 18GX3.5 QUINCKE PK (NEEDLE) ×3 IMPLANT
PACK ANTERIOR HIP CUSTOM (KITS) ×3 IMPLANT
SAW OSC TIP CART 19.5X105X1.3 (SAW) ×3 IMPLANT
SEALER BIPOLAR AQUA 6.0 (INSTRUMENTS) ×3 IMPLANT
SET HNDPC FAN SPRY TIP SCT (DISPOSABLE) ×1 IMPLANT
SUT ETHIBOND NAB CT1 #1 30IN (SUTURE) ×6 IMPLANT
SUT MNCRL AB 3-0 PS2 18 (SUTURE) ×3 IMPLANT
SUT MON AB 2-0 CT1 36 (SUTURE) ×6 IMPLANT
SUT STRATAFIX PDO 1 14 VIOLET (SUTURE) ×2
SUT STRATFX PDO 1 14 VIOLET (SUTURE) ×1
SUT VIC AB 2-0 CT1 27 (SUTURE) ×2
SUT VIC AB 2-0 CT1 TAPERPNT 27 (SUTURE) ×1 IMPLANT
SUTURE STRATFX PDO 1 14 VIOLET (SUTURE) ×1 IMPLANT
SYR 50ML LL SCALE MARK (SYRINGE) ×3 IMPLANT
TRAY FOLEY W/METER SILVER 16FR (SET/KITS/TRAYS/PACK) IMPLANT
WATER STERILE IRR 1000ML POUR (IV SOLUTION) ×3 IMPLANT
YANKAUER SUCT BULB TIP 10FT TU (MISCELLANEOUS) ×3 IMPLANT

## 2017-07-20 NOTE — Anesthesia Postprocedure Evaluation (Signed)
Anesthesia Post Note  Patient: Justin Winters  Procedure(s) Performed: LEFT TOTAL HIP ARTHROPLASTY ANTERIOR APPROACH (Left Hip)     Patient location during evaluation: PACU Anesthesia Type: Spinal Level of consciousness: awake and alert Pain management: pain level controlled Vital Signs Assessment: post-procedure vital signs reviewed and stable Respiratory status: spontaneous breathing and respiratory function stable Cardiovascular status: blood pressure returned to baseline and stable Postop Assessment: spinal receding Anesthetic complications: no    Last Vitals:  Vitals:   07/20/17 1200 07/20/17 1222  BP: (!) 129/96 (!) 138/98  Pulse: 77 76  Resp: 17 18  Temp: 36.6 C 36.7 C  SpO2: 97% 96%    Last Pain:  Vitals:   07/20/17 1200  TempSrc:   PainSc: 0-No pain                 Nolon Nations

## 2017-07-20 NOTE — Discharge Instructions (Signed)
°Dr. Crystal Scarberry °Joint Replacement Specialist °[facility] °[address] °[place], [place] [address] °[phone] ° ° °TOTAL HIP REPLACEMENT POSTOPERATIVE DIRECTIONS ° ° ° °Hip Rehabilitation, Guidelines Following Surgery  ° °WEIGHT BEARING °Weight bearing as tolerated with assist device (walker, cane, etc) as directed, use it as long as suggested by your surgeon or therapist, typically at least 4-6 weeks. ° °The results of a hip operation are greatly improved after range of motion and muscle strengthening exercises. Follow all safety measures which are given to protect your hip. If any of these exercises cause increased pain or swelling in your joint, decrease the amount until you are comfortable again. Then slowly increase the exercises. Call your caregiver if you have problems or questions.  ° °HOME CARE INSTRUCTIONS  °Most of the following instructions are designed to prevent the dislocation of your new hip.  °Remove items at home which could result in a fall. This includes throw rugs or furniture in walking pathways.  °Continue medications as instructed at time of discharge. °· You may have some home medications which will be placed on hold until you complete the course of blood thinner medication. °· You may start showering once you are discharged home. Do not remove your dressing. °Do not put on socks or shoes without following the instructions of your caregivers.   °Sit on chairs with arms. Use the chair arms to help push yourself up when arising.  °Arrange for the use of a toilet seat elevator so you are not sitting low.  °· Walk with walker as instructed.  °You may resume a sexual relationship in one month or when given the OK by your caregiver.  °Use walker as long as suggested by your caregivers.  °You may put full weight on your legs and walk as much as is comfortable. °Avoid periods of inactivity such as sitting longer than an hour when not asleep. This helps prevent  blood clots.  °You may return to work once you are cleared by your surgeon.  °Do not drive a car for 6 weeks or until released by your surgeon.  °Do not drive while taking narcotics.  °Wear elastic stockings for two weeks following surgery during the day but you may remove then at night.  °Make sure you keep all of your appointments after your operation with all of your doctors and caregivers. You should call the office at the above phone number and make an appointment for approximately two weeks after the date of your surgery. °Please pick up a stool softener and laxative for home use as long as you are requiring pain medications. °· ICE to the affected hip every three hours for 30 minutes at a time and then as needed for pain and swelling. Continue to use ice on the hip for pain and swelling from surgery. You may notice swelling that will progress down to the foot and ankle.  This is normal after surgery.  Elevate the leg when you are not up walking on it.   °It is important for you to complete the blood thinner medication as prescribed by your doctor. °· Continue to use the breathing machine which will help keep your temperature down.  It is common for your temperature to cycle up and down following surgery, especially at night when you are not up moving around and exerting yourself.  The breathing machine keeps your lungs expanded and your temperature down. ° °RANGE OF MOTION AND STRENGTHENING EXERCISES  °These exercises are   designed to help you keep full movement of your hip joint. Follow your caregiver's or physical therapist's instructions. Perform all exercises about fifteen times, three times per day or as directed. Exercise both hips, even if you have had only one joint replacement. These exercises can be done on a training (exercise) mat, on the floor, on a table or on a bed. Use whatever works the best and is most comfortable for you. Use music or television while you are exercising so that the exercises  are a pleasant break in your day. This will make your life better with the exercises acting as a break in routine you can look forward to.  °Lying on your back, slowly slide your foot toward your buttocks, raising your knee up off the floor. Then slowly slide your foot back down until your leg is straight again.  °Lying on your back spread your legs as far apart as you can without causing discomfort.  °Lying on your side, raise your upper leg and foot straight up from the floor as far as is comfortable. Slowly lower the leg and repeat.  °Lying on your back, tighten up the muscle in the front of your thigh (quadriceps muscles). You can do this by keeping your leg straight and trying to raise your heel off the floor. This helps strengthen the largest muscle supporting your knee.  °Lying on your back, tighten up the muscles of your buttocks both with the legs straight and with the knee bent at a comfortable angle while keeping your heel on the floor.  ° °SKILLED REHAB INSTRUCTIONS: °If the patient is transferred to a skilled rehab facility following release from the hospital, a list of the current medications will be sent to the facility for the patient to continue.  When discharged from the skilled rehab facility, please have the facility set up the patient's Home Health Physical Therapy prior to being released. Also, the skilled facility will be responsible for providing the patient with their medications at time of release from the facility to include their pain medication and their blood thinner medication. If the patient is still at the rehab facility at time of the two week follow up appointment, the skilled rehab facility will also need to assist the patient in arranging follow up appointment in our office and any transportation needs. ° °MAKE SURE YOU:  °Understand these instructions.  °Will watch your condition.  °Will get help right away if you are not doing well or get worse. ° °Pick up stool softner and  laxative for home use following surgery while on pain medications. °Do not remove your dressing. °The dressing is waterproof--it is OK to take showers. °Continue to use ice for pain and swelling after surgery. °Do not use any lotions or creams on the incision until instructed by your surgeon. °Total Hip Protocol. ° ° °

## 2017-07-20 NOTE — Anesthesia Procedure Notes (Signed)
Spinal  Patient location during procedure: OR Staffing Anesthesiologist: Nolon Nations, MD Resident/CRNA: Lavina Hamman, CRNA Performed: anesthesiologist  Preanesthetic Checklist Completed: patient identified, site marked, surgical consent, pre-op evaluation, timeout performed, IV checked, risks and benefits discussed and monitors and equipment checked Spinal Block Patient position: sitting Prep: ChloraPrep and site prepped and draped Patient monitoring: heart rate, continuous pulse ox and blood pressure Approach: midline Location: L2-3 Injection technique: single-shot Needle Needle type: Sprotte  Needle gauge: 24 G Needle length: 9 cm Additional Notes Patient tolerated procedure well, without complications.

## 2017-07-20 NOTE — Progress Notes (Signed)
Portable AP Pelvis X-ray done. 

## 2017-07-20 NOTE — Progress Notes (Signed)
RT placed patient on CPAP. Sterile water added to water chamber for humidification. Patient is tolerating well.

## 2017-07-20 NOTE — Transfer of Care (Signed)
Immediate Anesthesia Transfer of Care Note  Patient: Justin Winters  Procedure(s) Performed: LEFT TOTAL HIP ARTHROPLASTY ANTERIOR APPROACH (Left Hip)  Patient Location: PACU  Anesthesia Type:Spinal  Level of Consciousness: awake, alert  and oriented  Airway & Oxygen Therapy: Patient Spontanous Breathing and Patient connected to face mask oxygen  Post-op Assessment: Report given to RN  Post vital signs: Reviewed and stable  Last Vitals:  Vitals:   07/20/17 0514 07/20/17 0536  BP: (!) 162/116 (!) 158/109  Pulse:  80  Resp:    Temp:    SpO2:      Last Pain:  Vitals:   07/20/17 0513  TempSrc: Oral         Complications: No apparent anesthesia complications

## 2017-07-20 NOTE — Op Note (Signed)
OPERATIVE REPORT  SURGEON: Rod Can, MD   ASSISTANT: Nehemiah Massed, PA-C.  PREOPERATIVE DIAGNOSIS: Left hip arthritis.   POSTOPERATIVE DIAGNOSIS: Left hip arthritis.   PROCEDURE: Left total hip arthroplasty, anterior approach.   IMPLANTS: DePuy Tri Lock stem, size 9, std offset. DePuy Pinnacle Cup, size 62 mm. DePuy Altrx liner, size 36 by 62 mm, neutral. DePuy Biolox ceramic head ball, size 36 + 5 mm.  ANESTHESIA:  Spinal  ESTIMATED BLOOD LOSS:-600 mL    ANTIBIOTICS: 2 g Ancef.  DRAINS: None.  COMPLICATIONS: None.   CONDITION: PACU - hemodynamically stable.   BRIEF CLINICAL NOTE: Justin Winters is a 66 y.o. male with a long-standing history of Left hip arthritis. After failing conservative management, the patient was indicated for total hip arthroplasty. The risks, benefits, and alternatives to the procedure were explained, and the patient elected to proceed.  PROCEDURE IN DETAIL: Surgical site was marked by myself in the pre-op holding area. Once inside the operating room, spinal anesthesia was obtained, and a foley catheter was inserted. The patient was then positioned on the Hana table. All bony prominences were well padded. The hip was prepped and draped in the normal sterile surgical fashion. A time-out was called verifying side and site of surgery. The patient received IV antibiotics within 60 minutes of beginning the procedure.  The direct anterior approach to the hip was performed through the Hueter interval. Lateral femoral circumflex vessels were treated with the Auqumantys. The anterior capsule was exposed and an inverted T capsulotomy was made.The femoral neck cut was made to the level of the templated cut. A corkscrew was placed into the head and the head was removed. The femoral head was found to have eburnated bone. The head was passed to the back table and was measured.  Acetabular exposure was achieved, and the pulvinar and labrum were  excised. Sequential reaming of the acetabulum was then performed up to a size 61 mm reamer. A 62 mm cup was then opened and impacted into place at approximately 40 degrees of abduction and 20 degrees of anteversion. The final polyethylene liner was impacted into place and acetabular osteophytes were removed.   I then gained femoral exposure taking care to protect the abductors and greater trochanter. This was performed using standard external rotation, extension, and adduction. The capsule was peeled off the inner aspect of the greater trochanter, taking care to preserve the short external rotators. A cookie cutter was used to enter the femoral canal, and then the femoral canal finder was placed. Sequential broaching was performed up to a size 9. Calcar planer was used on the femoral neck remnant. I placed a std offset neck and a trial head ball. The hip was reduced. Leg lengths and offset were checked fluoroscopically. The hip was dislocated and trial components were removed. The final implants were placed, and the hip was reduced.  Fluoroscopy was used to confirm component position and leg lengths. At 90 degrees of external rotation and full extension, the hip was stable to an anterior directed force.  The wound was copiously irrigated with normal saline using pulse lavage. Marcaine solution was injected into the periarticular soft tissue. The wound was closed in layers using #1 Vicryl and V-Loc for the fascia, 2-0 Vicryl for the subcutaneous fat, 2-0 Monocryl for the deep dermal layer, 3-0 running Monocryl subcuticular stitch, and Dermabond for the skin. Once the glue was fully dried, an Aquacell Ag dressing was applied. The patient was transported to the recovery room in  stable condition. Sponge, needle, and instrument counts were correct at the end of the case x2. The patient tolerated the procedure well and there were no known complications.  Please note that a surgical assistant was a  medical necessity for this procedure to perform it in a safe and expeditious manner. Assistant was necessary to provide appropriate retraction of vital neurovascular structures, to prevent femoral fracture, and to allow for anatomic placement of the prosthesis.

## 2017-07-20 NOTE — Progress Notes (Signed)
X-ray results noted 

## 2017-07-20 NOTE — Evaluation (Signed)
Physical Therapy Evaluation Patient Details Name: Justin Winters MRN: [id number] DOB: February 15, 1951 Today's Date: 07/20/2017   History of Present Illness  Pt s/p L THR and with hx of PTSD and Prostate CA  Clinical Impression  Pt s/p L THR and presents with decreased L LE strength/ROM and post op pain limiting functional mobility.  Pt should progress to dc home with family assist.    Follow Up Recommendations DC plan and follow up therapy as arranged by surgeon    Equipment Recommendations  Rolling walker with 5" wheels    Recommendations for Other Services       Precautions / Restrictions Precautions Precautions: Fall Restrictions Weight Bearing Restrictions: No Other Position/Activity Restrictions: WBAT      Mobility  Bed Mobility Overal bed mobility: Needs Assistance Bed Mobility: Supine to Sit     Supine to sit: Min assist     General bed mobility comments: cues for sequence and use of R LE to self assist  Transfers Overall transfer level: Needs assistance Equipment used: Rolling walker (2 wheeled) Transfers: Sit to/from Stand Sit to Stand: Min assist         General transfer comment: cues for LE management and use of UEs to self assist  Ambulation/Gait Ambulation/Gait assistance: Min assist;Min guard Ambulation Distance (Feet): 80 Feet Assistive device: Rolling walker (2 wheeled) Gait Pattern/deviations: Step-to pattern;Decreased step length - right;Decreased step length - left;Shuffle;Trunk flexed Gait velocity: decr Gait velocity interpretation: Below normal speed for age/gender General Gait Details: cues for posture, position from RW and sequence  Stairs            Wheelchair Mobility    Modified Rankin (Stroke Patients Only)       Balance                                             Pertinent Vitals/Pain Pain Assessment: 0-10 Pain Score: 4  Pain Location: L hip Pain Descriptors / Indicators: Aching;Sore Pain  Intervention(s): Limited activity within patient's tolerance;Monitored during session;Premedicated before session;Ice applied    Home Living Family/patient expects to be discharged to:: Private residence Living Arrangements: Spouse/significant other Available Help at Discharge: Family Type of Home: House Home Access: Stairs to enter Entrance Stairs-Rails: Psychiatric nurse of Steps: 5 Home Layout: One level Home Equipment: None      Prior Function Level of Independence: Independent               Hand Dominance        Extremity/Trunk Assessment   Upper Extremity Assessment Upper Extremity Assessment: Overall WFL for tasks assessed    Lower Extremity Assessment Lower Extremity Assessment: LLE deficits/detail    Cervical / Trunk Assessment Cervical / Trunk Assessment: Normal  Communication   Communication: No difficulties  Cognition Arousal/Alertness: Awake/alert Behavior During Therapy: WFL for tasks assessed/performed Overall Cognitive Status: Within Functional Limits for tasks assessed                                        General Comments      Exercises Total Joint Exercises Ankle Circles/Pumps: AROM;Both;15 reps;Supine   Assessment/Plan    PT Assessment Patient needs continued PT services  PT Problem List Decreased strength;Decreased range of motion;Decreased activity tolerance;Decreased mobility;Decreased knowledge of use of DME;Pain  PT Treatment Interventions DME instruction;Gait training;Stair training;Therapeutic exercise;Therapeutic activities;Functional mobility training;Patient/family education    PT Goals (Current goals can be found in the Care Plan section)  Acute Rehab PT Goals Patient Stated Goal: Regain IND and walk without pain PT Goal Formulation: With patient Time For Goal Achievement: 07/25/17 Potential to Achieve Goals: Good    Frequency 7X/week   Barriers to discharge         Co-evaluation               AM-PAC PT "6 Clicks" Daily Activity  Outcome Measure Difficulty turning over in bed (including adjusting bedclothes, sheets and blankets)?: Unable Difficulty moving from lying on back to sitting on the side of the bed? : Unable Difficulty sitting down on and standing up from a chair with arms (e.g., wheelchair, bedside commode, etc,.)?: Unable Help needed moving to and from a bed to chair (including a wheelchair)?: A Little Help needed walking in hospital room?: A Little Help needed climbing 3-5 steps with a railing? : A Little 6 Click Score: 12    End of Session Equipment Utilized During Treatment: Gait belt Activity Tolerance: Patient tolerated treatment well Patient left: in chair;with call bell/phone within reach;with family/visitor present Nurse Communication: Mobility status PT Visit Diagnosis: Difficulty in walking, not elsewhere classified (R26.2)    Time: 6333-5456 PT Time Calculation (min) (ACUTE ONLY): 30 min   Charges:   PT Evaluation $PT Eval Low Complexity: 1 Low PT Treatments $Gait Training: 8-22 mins   PT G Codes:        Pg [phone]   Adie Vilar 07/20/2017, 5:39 PM

## 2017-07-20 NOTE — Interval H&P Note (Signed)
History and Physical Interval Note:  07/20/2017 7:32 AM  Justin Winters  has presented today for surgery, with the diagnosis of Degenerative joint disease left hip  The various methods of treatment have been discussed with the patient and family. After consideration of risks, benefits and other options for treatment, the patient has consented to  Procedure(s) with comments: LEFT TOTAL HIP ARTHROPLASTY ANTERIOR APPROACH (Left) - Needs RNFA as a surgical intervention .  The patient's history has been reviewed, patient examined, no change in status, stable for surgery.  I have reviewed the patient's chart and labs.  Questions were answered to the patient's satisfaction.     Rayven Hendrickson, Horald Pollen

## 2017-07-21 LAB — CBC
HEMATOCRIT: 36.5 % — AB (ref 39.0–52.0)
HEMOGLOBIN: 12 g/dL — AB (ref 13.0–17.0)
MCH: 33.4 pg (ref 26.0–34.0)
MCHC: 32.9 g/dL (ref 30.0–36.0)
MCV: 101.7 fL — ABNORMAL HIGH (ref 78.0–100.0)
Platelets: 153 10*3/uL (ref 150–400)
RBC: 3.59 MIL/uL — ABNORMAL LOW (ref 4.22–5.81)
RDW: 12.5 % (ref 11.5–15.5)
WBC: 8.7 10*3/uL (ref 4.0–10.5)

## 2017-07-21 LAB — BASIC METABOLIC PANEL
Anion gap: 7 (ref 5–15)
BUN: 20 mg/dL (ref 6–20)
CHLORIDE: 105 mmol/L (ref 101–111)
CO2: 25 mmol/L (ref 22–32)
CREATININE: 1.24 mg/dL (ref 0.61–1.24)
Calcium: 8.7 mg/dL — ABNORMAL LOW (ref 8.9–10.3)
GFR calc Af Amer: >60 mL/min (ref >60)
GFR calc non Af Amer: 60 mL/min — ABNORMAL LOW (ref >60)
GLUCOSE: 118 mg/dL — AB (ref 65–99)
Potassium: 4.3 mmol/L (ref 3.5–5.1)
Sodium: 137 mmol/L (ref 135–145)

## 2017-07-21 MED ORDER — HYDROCODONE-ACETAMINOPHEN 5-325 MG PO TABS: 1.0000 | ORAL_TABLET | ORAL | 0 refills | Status: AC | PRN

## 2017-07-21 MED ORDER — ASPIRIN 81 MG PO CHEW: 81.0000 mg | CHEWABLE_TABLET | Freq: Two times a day (BID) | ORAL | 1 refills | Status: AC

## 2017-07-21 MED ORDER — SENNA 8.6 MG PO TABS: 2.0000 | ORAL_TABLET | Freq: Every day | ORAL | 0 refills | Status: AC

## 2017-07-21 MED ORDER — DOCUSATE SODIUM 100 MG PO CAPS: 100.0000 mg | ORAL_CAPSULE | Freq: Two times a day (BID) | ORAL | 1 refills | Status: AC

## 2017-07-21 MED ORDER — ONDANSETRON HCL 4 MG PO TABS: 4.0000 mg | ORAL_TABLET | Freq: Four times a day (QID) | ORAL | 0 refills | Status: AC | PRN

## 2017-07-21 NOTE — Progress Notes (Signed)
   Subjective:  Patient reports pain as mild to moderate.  Denies N/V/CP/SOB.  Objective:   VITALS:   Vitals:   07/20/17 1800 07/20/17 2143 07/21/17 0230 07/21/17 0544  BP: (!) 136/96 116/81 121/84 115/85  Pulse: 87 79 70 71  Resp: 16 18 17 17   Temp: 97.7 F (36.5 C) 97.9 F (36.6 C) 98.3 F (36.8 C) 97.8 F (36.6 C)  TempSrc: Oral Oral Oral Oral  SpO2: 98% 96% 94% 98%  Weight:      Height:        NAD ABD soft Sensation intact distally Intact pulses distally Dorsiflexion/Plantar flexion intact Incision: dressing C/D/I Compartment soft   Lab Results  Component Value Date   WBC 8.7 07/21/2017   HGB 12.0 (L) 07/21/2017   HCT 36.5 (L) 07/21/2017   MCV 101.7 (H) 07/21/2017   PLT 153 07/21/2017   BMET    Component Value Date/Time   NA 137 07/21/2017 0528   K 4.3 07/21/2017 0528   CL 105 07/21/2017 0528   CO2 25 07/21/2017 0528   GLUCOSE 118 (H) 07/21/2017 0528   BUN 20 07/21/2017 0528   CREATININE 1.24 07/21/2017 0528   CALCIUM 8.7 (L) 07/21/2017 0528   GFRNONAA 60 (L) 07/21/2017 0528   GFRAA >60 07/21/2017 0528     Assessment/Plan: 1 Day Post-Op   Principal Problem:   Osteoarthritis of left hip   WBAT with walker ASA, SCDs, TEDs PT/OT PO pain control Dispo: d/c home with HEP, needs RW   Nayelly Laughman, Horald Pollen 07/21/2017, 7:46 AM   Rod Can, MD Cell [phone]

## 2017-07-21 NOTE — Progress Notes (Signed)
Pt ready for discharge home with family. Reviewed all discharge instructions/ follow up appointmens and prescriptions.  Wife at bedside.

## 2017-07-21 NOTE — Plan of Care (Signed)
  Completed/Met Education: Knowledge of General Education information will improve 07/21/2017 1632 - Completed/Met by Milderd Meager, RN Health Behavior/Discharge Planning: Ability to manage health-related needs will improve 07/21/2017 1632 - Completed/Met by Milderd Meager, RN Clinical Measurements: Ability to maintain clinical measurements within normal limits will improve 07/21/2017 1632 - Completed/Met by Milderd Meager, RN Will remain free from infection 07/21/2017 1632 - Completed/Met by Milderd Meager, RN Diagnostic test results will improve 07/21/2017 1632 - Completed/Met by Milderd Meager, RN Respiratory complications will improve 07/21/2017 1632 - Completed/Met by Milderd Meager, RN Cardiovascular complication will be avoided 07/21/2017 1632 - Completed/Met by Milderd Meager, RN Activity: Risk for activity intolerance will decrease 07/21/2017 1632 - Completed/Met by Milderd Meager, RN Nutrition: Adequate nutrition will be maintained 07/21/2017 1632 - Completed/Met by Milderd Meager, RN Coping: Level of anxiety will decrease 07/21/2017 1632 - Completed/Met by Milderd Meager, RN Elimination: Will not experience complications related to bowel motility 07/21/2017 1632 - Completed/Met by Milderd Meager, RN Will not experience complications related to urinary retention 07/21/2017 1632 - Completed/Met by Milderd Meager, RN Pain Managment: General experience of comfort will improve 07/21/2017 1632 - Completed/Met by Milderd Meager, RN Safety: Ability to remain free from injury will improve 07/21/2017 1632 - Completed/Met by Milderd Meager, RN Skin Integrity: Risk for impaired skin integrity will decrease 07/21/2017 1632 - Completed/Met by Milderd Meager, RN Education: Knowledge of the prescribed therapeutic regimen will improve 07/21/2017 1632 - Completed/Met by Milderd Meager, RN Understanding of discharge needs will  improve 07/21/2017 1632 - Completed/Met by Milderd Meager, RN Activity: Ability to avoid complications of mobility impairment will improve 07/21/2017 1632 - Completed/Met by Milderd Meager, RN Ability to tolerate increased activity will improve 07/21/2017 1632 - Completed/Met by Milderd Meager, RN Clinical Measurements: Postoperative complications will be avoided or minimized 07/21/2017 1632 - Completed/Met by Milderd Meager, RN Pain Management: Pain level will decrease with appropriate interventions 07/21/2017 1632 - Completed/Met by Milderd Meager, RN Skin Integrity: Signs of wound healing will improve 07/21/2017 1632 - Completed/Met by Milderd Meager, RN

## 2017-07-21 NOTE — Discharge Summary (Signed)
Physician Discharge Summary  Patient ID: Justin Winters MRN: [id number] DOB/AGE: December 06, 1950 66 y.o.  Admit date: 07/20/2017 Discharge date: 07/21/2017  Admission Diagnoses:  Osteoarthritis of left hip  Discharge Diagnoses:  Principal Problem:   Osteoarthritis of left hip   Past Medical History:  Diagnosis Date  . Anxiety   . Arthritis   . Depression   . Groin strain   . Hx of radiation therapy 07/20/12   prostate seed implant  . Hyperlipidemia   . Hypogonadism male   . Post-traumatic stress syndrome   . Prostate cancer ([facility]) 05/02/12   gleason 6, volume 54 cc, 2011 PSA 3.0  . Sleep apnea    has cpap- noncompliant    Surgeries: Procedure(s): LEFT TOTAL HIP ARTHROPLASTY ANTERIOR APPROACH on 07/20/2017   Consultants (if any):   Discharged Condition: Improved  Hospital Course: Justin Winters is an 66 y.o. male who was admitted 07/20/2017 with a diagnosis of Osteoarthritis of left hip and went to the operating room on 07/20/2017 and underwent the above named procedures.    He was given perioperative antibiotics:  Anti-infectives (From admission, onward)   Start     Dose/Rate Route Frequency Ordered Stop   07/20/17 1400  ceFAZolin (ANCEF) IVPB 2g/100 mL premix     2 g 200 mL/hr over 30 Minutes Intravenous Every 6 hours 07/20/17 1221 07/20/17 2245   07/20/17 0600  ceFAZolin (ANCEF) 3 g in dextrose 5 % 50 mL IVPB     3 g 130 mL/hr over 30 Minutes Intravenous On call to O.R. 07/19/17 1136 07/20/17 0754    .  He was given sequential compression devices, early ambulation, and ASA for DVT prophylaxis.  He benefited maximally from the hospital stay and there were no complications.    Recent vital signs:  Vitals:   07/21/17 0544 07/21/17 1025  BP: 115/85 (!) 144/91  Pulse: 71 83  Resp: 17 18  Temp: 97.8 F (36.6 C) 98 F (36.7 C)  SpO2: 98% 97%    Recent laboratory studies:  Lab Results  Component Value Date   HGB 12.0 (L) 07/21/2017   HGB 14.9 07/14/2017   HGB  15.2 12/29/2014   Lab Results  Component Value Date   WBC 8.7 07/21/2017   PLT 153 07/21/2017   Lab Results  Component Value Date   INR 0.92 07/13/2012   Lab Results  Component Value Date   NA 137 07/21/2017   K 4.3 07/21/2017   CL 105 07/21/2017   CO2 25 07/21/2017   BUN 20 07/21/2017   CREATININE 1.24 07/21/2017   GLUCOSE 118 (H) 07/21/2017    Discharge Medications:   Allergies as of 07/21/2017   No Known Allergies     Medication List    TAKE these medications   aspirin 81 MG chewable tablet Chew 1 tablet (81 mg total) 2 (two) times daily by mouth.   docusate sodium 100 MG capsule Commonly known as:  COLACE Take 1 capsule (100 mg total) 2 (two) times daily by mouth.   Fish Oil 1000 MG Caps Take 1,000 mg by mouth daily.   HYDROcodone-acetaminophen 5-325 MG tablet Commonly known as:  NORCO/VICODIN Take 1-2 tablets every 4 (four) hours as needed by mouth for moderate pain.   multivitamin with minerals Tabs tablet Take 1 tablet by mouth daily.   ondansetron 4 MG tablet Commonly known as:  ZOFRAN Take 1 tablet (4 mg total) every 6 (six) hours as needed by mouth for nausea.   senna 8.6 MG  Tabs tablet Commonly known as:  SENOKOT Take 2 tablets (17.2 mg total) at bedtime by mouth.   tadalafil 20 MG tablet Commonly known as:  CIALIS Take 1 tablet (20 mg total) by mouth every other day as needed for erectile dysfunction.            Durable Medical Equipment  (From admission, onward)        Start     Ordered   07/20/17 1222  DME Walker rolling  Once    Question:  Patient needs a walker to treat with the following condition  Answer:  Status post total hip replacement, left   07/20/17 1221   07/20/17 1222  DME 3 n 1  Once     07/20/17 1221      Diagnostic Studies: Dg Pelvis Portable  Result Date: 07/20/2017 CLINICAL DATA:  Post- operative left hip images EXAM: PORTABLE PELVIS 1-2 VIEWS COMPARISON:  None in PACs FINDINGS: The patient has undergone  left hip joint prosthesis placement. Radiographic positioning of the prosthetic components is good. The interface with the native bone appears normal. IMPRESSION: There is no immediate postprocedure complication following left hip joint prosthesis placement. Electronically Signed   By: David  Martinique M.D.   On: 07/20/2017 11:15   Dg C-arm 1-60 Min-no Report  Result Date: 07/20/2017 Fluoroscopy was utilized by the requesting physician.  No radiographic interpretation.   Dg Hip Operative Unilat W Or W/o Pelvis Left  Result Date: 07/20/2017 CLINICAL DATA:  Status post anterior approach left hip joint prosthesis placement. EXAM: OPERATIVE left HIP (WITH PELVIS IF PERFORMED) 8 VIEWS TECHNIQUE: Fluoroscopic spot image(s) were submitted for interpretation post-operatively. COMPARISON:  None in PACs FINDINGS: Fluoro time reported is 20 seconds. The patient has undergone left hip joint prosthesis placement. Radiographic positioning of the prosthetic components is good. The interface with the native bone appears normal. There are prostatic seed implants present. IMPRESSION: No immediate postprocedure complication following anterior approach left hip joint prosthesis placement. Electronically Signed   By: David  Martinique M.D.   On: 07/20/2017 11:50    Disposition: 01-Home or Self Care  Discharge Instructions    Call MD / Call 911   Complete by:  As directed    If you experience chest pain or shortness of breath, CALL 911 and be transported to the hospital emergency room.  If you develope a fever above 101 F, pus (white drainage) or increased drainage or redness at the wound, or calf pain, call your surgeon's office.   Constipation Prevention   Complete by:  As directed    Drink plenty of fluids.  Prune juice may be helpful.  You may use a stool softener, such as Colace (over the counter) 100 mg twice a day.  Use MiraLax (over the counter) for constipation as needed.   Diet - low sodium heart healthy   Complete  by:  As directed    Driving restrictions   Complete by:  As directed    No driving for 6 weeks   Increase activity slowly as tolerated   Complete by:  As directed    Lifting restrictions   Complete by:  As directed    No lifting for 6 weeks   TED hose   Complete by:  As directed    Use stockings (TED hose) for 2 weeks on both leg(s).  You may remove them at night for sleeping.      Follow-up Information    Emersen Mascari, Aaron Edelman, MD. Schedule an appointment  as soon as possible for a visit in 2 weeks.   Specialty:  Orthopedic Surgery Why:  For wound re-check Contact information: [address] [place] [address] [phone]            Signed: Elie Goody 07/21/2017, 2:41 PM

## 2017-07-21 NOTE — Progress Notes (Signed)
    Durable Medical Equipment  (From admission, onward)        Start     Ordered   07/20/17 1222  DME Walker rolling  Once    Question:  Patient needs a walker to treat with the following condition  Answer:  Status post total hip replacement, left   07/20/17 1221   07/20/17 1222  DME 3 n 1  Once     07/20/17 1221

## 2017-07-21 NOTE — Progress Notes (Signed)
Physical Therapy Treatment Patient Details Name: Justin Winters MRN: [id number] DOB: 1951-09-09 Today's Date: 07/21/2017    History of Present Illness Pt s/p L THR and with hx of PTSD and Prostate CA    PT Comments    POD # 1 Assisted with amb a greater distance, practiced stairs then returned to room to perform THR TE's following HEP.  Spouse present during session for "hands on" instruction.  Pt had questions on shower transfer so paged OT.    Follow Up Recommendations  Other (comment);No PT follow up(HEP)     Equipment Recommendations  Rolling walker with 5" wheels    Recommendations for Other Services       Precautions / Restrictions Precautions Precautions: Fall Restrictions Weight Bearing Restrictions: No Other Position/Activity Restrictions: WBAT    Mobility  Bed Mobility Overal bed mobility: Needs Assistance Bed Mobility: Supine to Sit     Supine to sit: Supervision;Min guard     General bed mobility comments: assist L LE off bed with increased time  Transfers Overall transfer level: Needs assistance Equipment used: Rolling walker (2 wheeled) Transfers: Sit to/from [facility] Sit to Stand: Modified independent (Device/Increase time);Supervision Stand pivot transfers: Modified independent (Device/Increase time);Supervision       General transfer comment: one VC safety with turns   Ambulation/Gait Ambulation/Gait assistance: Supervision Ambulation Distance (Feet): 145 Feet Assistive device: Rolling walker (2 wheeled) Gait Pattern/deviations: Step-to pattern;Decreased step length - right;Decreased step length - left;Shuffle;Trunk flexed Gait velocity: decreased    General Gait Details: < 25% cues for posture, position from RW and sequence   Stairs Stairs: Yes   Stair Management: One rail Right;Step to pattern;Forwards Number of Stairs: 4 General stair comments: 25% VC's on proper sequencing and safety   performed well    Wheelchair Mobility    Modified Rankin (Stroke Patients Only)       Balance                                            Cognition Arousal/Alertness: Awake/alert Behavior During Therapy: WFL for tasks assessed/performed Overall Cognitive Status: Within Functional Limits for tasks assessed                                        Exercises   Total Hip Replacement TE's 10 reps ankle pumps 10 reps knee presses 10 reps heel slides 10 reps SAQ's 10 reps ABD 10 reps LAQ's 10 reps all standing TE's Followed by ICE  Handout also issued    General Comments        Pertinent Vitals/Pain Pain Assessment: 0-10 Pain Score: 4  Pain Location: L hip Pain Descriptors / Indicators: Tightness;Grimacing;Operative site guarding Pain Intervention(s): Monitored during session;Repositioned;Ice applied    Home Living Family/patient expects to be discharged to:: Private residence Living Arrangements: Spouse/significant other Available Help at Discharge: Family           Additional Comments: 3:1 was delivered to room    Prior Function Level of Independence: Independent          PT Goals (current goals can now be found in the care plan section) Acute Rehab PT Goals Patient Stated Goal: Regain IND and walk without pain Progress towards PT goals: Progressing toward goals    Frequency  7X/week      PT Plan      Co-evaluation              AM-PAC PT "6 Clicks" Daily Activity  Outcome Measure  Difficulty turning over in bed (including adjusting bedclothes, sheets and blankets)?: A Lot Difficulty moving from lying on back to sitting on the side of the bed? : A Lot Difficulty sitting down on and standing up from a chair with arms (e.g., wheelchair, bedside commode, etc,.)?: A Lot Help needed moving to and from a bed to chair (including a wheelchair)?: A Little Help needed walking in hospital room?: A Little Help needed climbing 3-5  steps with a railing? : A Little 6 Click Score: 15    End of Session Equipment Utilized During Treatment: Gait belt Activity Tolerance: Patient tolerated treatment well Patient left: in chair;with call bell/phone within reach;with family/visitor present Nurse Communication: Mobility status PT Visit Diagnosis: Difficulty in walking, not elsewhere classified (R26.2)     Time: 1005-1040 PT Time Calculation (min) (ACUTE ONLY): 35 min  Charges:  $Gait Training: 8-22 mins $Therapeutic Exercise: 8-22 mins                    G Codes:       Rica Koyanagi  PTA [facility]  Acute  Rehab Pager      [phone]

## 2017-07-21 NOTE — Evaluation (Signed)
Occupational Therapy Evaluation Patient Details Name: Justin Winters MRN: [id number] DOB: 12/07/1950 Today's Date: 07/21/2017    History of Present Illness Pt s/p L THR and with hx of PTSD and Prostate CA   Clinical Impression   This 66 year old man was admitted for the above sx. All education was completed. No further OT is needed at this time    Follow Up Recommendations  No OT follow up;Supervision - Intermittent    Equipment Recommendations  3 in 1 bedside commode(delivered)    Recommendations for Other Services       Precautions / Restrictions Precautions Precautions: Fall Restrictions Other Position/Activity Restrictions: WBAT      Mobility Bed Mobility               General bed mobility comments: OOB  Transfers       Sit to Stand: Supervision         General transfer comment: cues for UE/LE placement    Balance                                           ADL either performed or assessed with clinical judgement   ADL Overall ADL's : Needs assistance/impaired                         Toilet Transfer: Supervision/safety;Ambulation;BSC;RW       Tub/ Shower Transfer: Walk-in shower;Supervision/safety;Ambulation;3 in 1     General ADL Comments: pt's wife will assist with adls at home.  Set up bathing items so she could help him at the end of session. Practiced bathroom transfers. Pt has a built in seat in shower, but it is slippery. He feels that 3:1 will fit inside. Educated wife to wipe off legs after use.  Reviewed general safety.  Pt with no further questions for OT     Vision         Perception     Praxis      Pertinent Vitals/Pain Pain Score: 2  Pain Location: L hip Pain Descriptors / Indicators: Tightness Pain Intervention(s): Limited activity within patient's tolerance;Monitored during session;Premedicated before session;Repositioned     Hand Dominance     Extremity/Trunk Assessment Upper  Extremity Assessment Upper Extremity Assessment: Overall WFL for tasks assessed           Communication Communication Communication: No difficulties   Cognition Arousal/Alertness: Awake/alert Behavior During Therapy: WFL for tasks assessed/performed Overall Cognitive Status: Within Functional Limits for tasks assessed                                     General Comments       Exercises     Shoulder Instructions      Home Living Family/patient expects to be discharged to:: Private residence Living Arrangements: Spouse/significant other Available Help at Discharge: Family               Bathroom Shower/Tub: Walk-in Psychologist, prison and probation services: Standard         Additional Comments: 3:1 was delivered to room      Prior Functioning/Environment Level of Independence: Independent                 OT Problem List:        OT  Treatment/Interventions:      OT Goals(Current goals can be found in the care plan section) Acute Rehab OT Goals Patient Stated Goal: Regain IND and walk without pain OT Goal Formulation: All assessment and education complete, DC therapy  OT Frequency:     Barriers to D/C:            Co-evaluation              AM-PAC PT "6 Clicks" Daily Activity     Outcome Measure Help from another person eating meals?: None Help from another person taking care of personal grooming?: A Little Help from another person toileting, which includes using toliet, bedpan, or urinal?: A Little Help from another person bathing (including washing, rinsing, drying)?: A Little Help from another person to put on and taking off regular upper body clothing?: A Little Help from another person to put on and taking off regular lower body clothing?: A Lot 6 Click Score: 18   End of Session    Activity Tolerance: Patient tolerated treatment well Patient left: in chair;with call bell/phone within reach;with family/visitor present  OT Visit  Diagnosis: Pain Pain - Right/Left: Left Pain - part of body: Hip                Time: 1148-1200 OT Time Calculation (min): 12 min Charges:  OT General Charges $OT Visit: 1 Visit OT Evaluation $OT Eval Low Complexity: 1 Low G-Codes:     [place], OTR/L [phone] 07/21/2017  Fountain Derusha 07/21/2017, 12:48 PM

## 2018-09-16 ENCOUNTER — Encounter: Payer: Self-pay | Admitting: Gastroenterology

## 2019-10-01 IMAGING — DX DG PORTABLE PELVIS
1 series · 1 of 1 positions shown · non-contrast
Comparison: None in PACs

CLINICAL DATA: Post- operative left hip images

EXAM:
PORTABLE PELVIS 1-2 VIEWS

[pelvis ap]
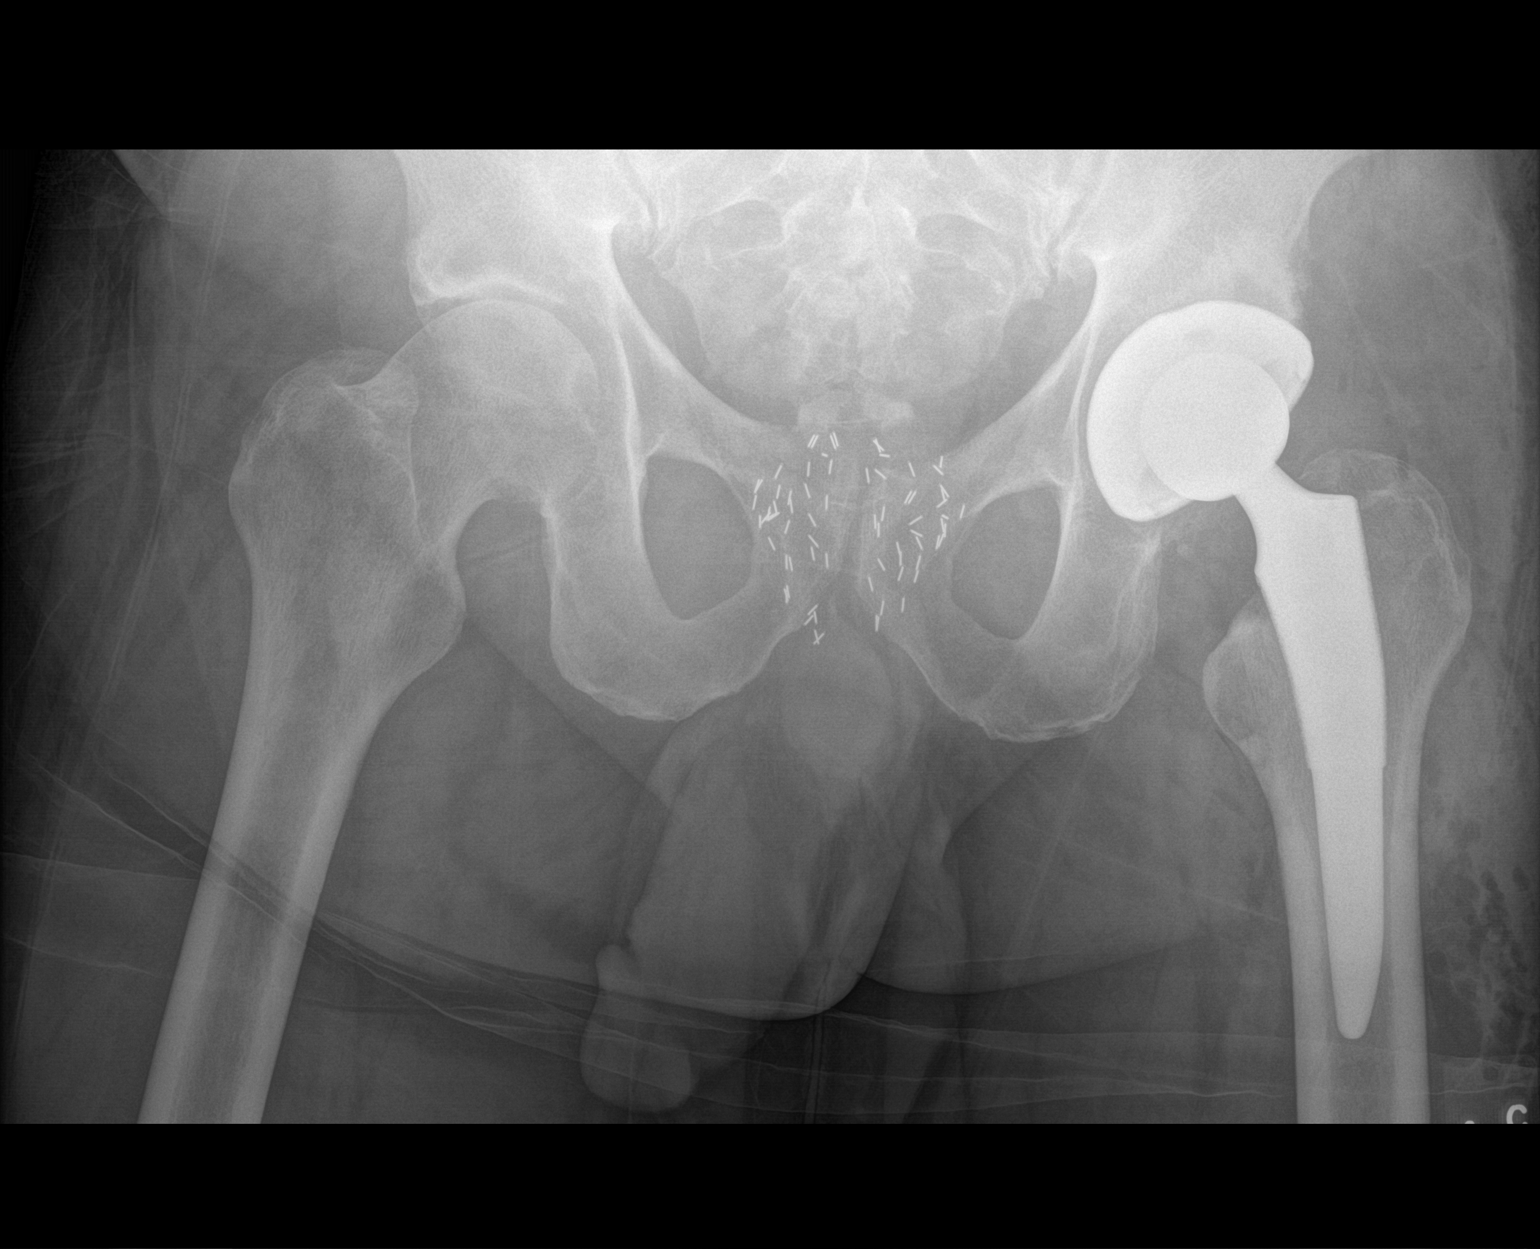

[1 of 1 positions shown; findings below may reference images not displayed]

FINDINGS: The patient has undergone left hip joint prosthesis placement.
Radiographic positioning of the prosthetic components is good. The
interface with the native bone appears normal.
IMPRESSION: There is no immediate postprocedure complication following left hip
joint prosthesis placement.

## 2020-06-26 ENCOUNTER — Ambulatory Visit: Payer: TRICARE For Life (TFL) | Attending: Internal Medicine

## 2020-06-26 DIAGNOSIS — Z23 Encounter for immunization: Secondary | ICD-10-CM

## 2020-06-26 NOTE — Progress Notes (Signed)
   Covid-19 Vaccination Clinic  Name:  Terris Germano    MRN: [id number] DOB: 04-06-1951  06/26/2020  Mr. Portell was observed post Covid-19 immunization for 15 minutes without incident. He was provided with Vaccine Information Sheet and instruction to access the V-Safe system.   Mr. Symonette was instructed to call 911 with any severe reactions post vaccine: Marland Kitchen Difficulty breathing  . Swelling of face and throat  . A fast heartbeat  . A bad rash all over body  . Dizziness and weakness

## 2022-06-06 ENCOUNTER — Encounter: Payer: Self-pay | Admitting: *Deleted
